# Patient Record
Sex: Female | Born: 1960 | Race: White | Hispanic: No | Marital: Married | Smoking: Never smoker
Health system: Southern US, Community
[De-identification: ages and names within clinical notes are randomized; demographics above are authoritative.]

## PROBLEM LIST (undated history)

## (undated) DIAGNOSIS — M1990 Unspecified osteoarthritis, unspecified site: Secondary | ICD-10-CM

## (undated) DIAGNOSIS — K219 Gastro-esophageal reflux disease without esophagitis: Secondary | ICD-10-CM

## (undated) DIAGNOSIS — Z9289 Personal history of other medical treatment: Secondary | ICD-10-CM

## (undated) DIAGNOSIS — K8020 Calculus of gallbladder without cholecystitis without obstruction: Secondary | ICD-10-CM

## (undated) DIAGNOSIS — N390 Urinary tract infection, site not specified: Secondary | ICD-10-CM

## (undated) DIAGNOSIS — E785 Hyperlipidemia, unspecified: Secondary | ICD-10-CM

## (undated) DIAGNOSIS — I10 Essential (primary) hypertension: Secondary | ICD-10-CM

## (undated) HISTORY — PX: HIP SURGERY: SHX245

## (undated) HISTORY — DX: Calculus of gallbladder without cholecystitis without obstruction: K80.20

## (undated) HISTORY — DX: Urinary tract infection, site not specified: N39.0

## (undated) HISTORY — DX: Gastro-esophageal reflux disease without esophagitis: K21.9

## (undated) HISTORY — DX: Personal history of other medical treatment: Z92.89

---

## 1987-01-12 HISTORY — PX: APPENDECTOMY: SHX54

## 1987-01-12 HISTORY — PX: CHOLECYSTECTOMY: SHX55

## 2007-01-12 HISTORY — PX: TOTAL HIP ARTHROPLASTY: SHX124

## 2018-08-25 ENCOUNTER — Other Ambulatory Visit: Payer: Self-pay

## 2018-08-25 ENCOUNTER — Encounter: Payer: Self-pay | Admitting: *Deleted

## 2018-08-25 ENCOUNTER — Emergency Department
Admission: EM | Admit: 2018-08-25 | Discharge: 2018-08-25 | Payer: Self-pay | Source: Home / Self Care | Attending: Family Medicine | Admitting: Family Medicine

## 2018-08-25 ENCOUNTER — Encounter (HOSPITAL_COMMUNITY): Payer: Self-pay | Admitting: *Deleted

## 2018-08-25 ENCOUNTER — Emergency Department (HOSPITAL_COMMUNITY): Payer: Medicare Other

## 2018-08-25 ENCOUNTER — Inpatient Hospital Stay (HOSPITAL_COMMUNITY)
Admission: EM | Admit: 2018-08-25 | Discharge: 2018-08-27 | DRG: 812 | Disposition: A | Discharge status: Home / Self Care | Payer: Medicare Other | Attending: Internal Medicine | Admitting: Internal Medicine

## 2018-08-25 DIAGNOSIS — Z20828 Contact with and (suspected) exposure to other viral communicable diseases: Secondary | ICD-10-CM | POA: Diagnosis present

## 2018-08-25 DIAGNOSIS — D649 Anemia, unspecified: Secondary | ICD-10-CM

## 2018-08-25 DIAGNOSIS — E785 Hyperlipidemia, unspecified: Secondary | ICD-10-CM | POA: Diagnosis present

## 2018-08-25 DIAGNOSIS — M1990 Unspecified osteoarthritis, unspecified site: Secondary | ICD-10-CM | POA: Diagnosis present

## 2018-08-25 DIAGNOSIS — Z8051 Family history of malignant neoplasm of kidney: Secondary | ICD-10-CM

## 2018-08-25 DIAGNOSIS — R609 Edema, unspecified: Secondary | ICD-10-CM

## 2018-08-25 DIAGNOSIS — D509 Iron deficiency anemia, unspecified: Principal | ICD-10-CM | POA: Diagnosis present

## 2018-08-25 DIAGNOSIS — R600 Localized edema: Secondary | ICD-10-CM

## 2018-08-25 DIAGNOSIS — Z79899 Other long term (current) drug therapy: Secondary | ICD-10-CM

## 2018-08-25 DIAGNOSIS — I10 Essential (primary) hypertension: Secondary | ICD-10-CM | POA: Diagnosis present

## 2018-08-25 DIAGNOSIS — Z993 Dependence on wheelchair: Secondary | ICD-10-CM

## 2018-08-25 DIAGNOSIS — Z96649 Presence of unspecified artificial hip joint: Secondary | ICD-10-CM | POA: Diagnosis present

## 2018-08-25 DIAGNOSIS — R0602 Shortness of breath: Secondary | ICD-10-CM

## 2018-08-25 DIAGNOSIS — Z880 Allergy status to penicillin: Secondary | ICD-10-CM

## 2018-08-25 DIAGNOSIS — Z91048 Other nonmedicinal substance allergy status: Secondary | ICD-10-CM

## 2018-08-25 DIAGNOSIS — I878 Other specified disorders of veins: Secondary | ICD-10-CM | POA: Diagnosis present

## 2018-08-25 DIAGNOSIS — Z9049 Acquired absence of other specified parts of digestive tract: Secondary | ICD-10-CM

## 2018-08-25 DIAGNOSIS — K922 Gastrointestinal hemorrhage, unspecified: Secondary | ICD-10-CM | POA: Diagnosis present

## 2018-08-25 DIAGNOSIS — Z882 Allergy status to sulfonamides status: Secondary | ICD-10-CM

## 2018-08-25 DIAGNOSIS — I517 Cardiomegaly: Secondary | ICD-10-CM

## 2018-08-25 DIAGNOSIS — Z6836 Body mass index (BMI) 36.0-36.9, adult: Secondary | ICD-10-CM

## 2018-08-25 HISTORY — DX: Essential (primary) hypertension: I10

## 2018-08-25 HISTORY — DX: Unspecified osteoarthritis, unspecified site: M19.90

## 2018-08-25 HISTORY — DX: Hyperlipidemia, unspecified: E78.5

## 2018-08-25 LAB — POCT CBC W AUTO DIFF (K'VILLE URGENT CARE)

## 2018-08-25 LAB — PREPARE RBC (CROSSMATCH)

## 2018-08-25 LAB — I-STAT BETA HCG BLOOD, ED (MC, WL, AP ONLY): I-stat hCG, quantitative: <5 m[IU]/mL (ref <5)

## 2018-08-25 LAB — ABO/RH: ABO/RH(D): A POS

## 2018-08-25 LAB — CBC
HCT: 13.7 % — ABNORMAL LOW (ref 36.0–46.0)
Hemoglobin: 3.2 g/dL — CL (ref 12.0–15.0)
MCH: 14.7 pg — ABNORMAL LOW (ref 26.0–34.0)
MCHC: 23.4 g/dL — ABNORMAL LOW (ref 30.0–36.0)
MCV: 62.8 fL — ABNORMAL LOW (ref 80.0–100.0)
Platelets: 347 10*3/uL (ref 150–400)
RBC: 2.18 MIL/uL — ABNORMAL LOW (ref 3.87–5.11)
RDW: 19.4 % — ABNORMAL HIGH (ref 11.5–15.5)
WBC: 8.1 10*3/uL (ref 4.0–10.5)

## 2018-08-25 LAB — COMPREHENSIVE METABOLIC PANEL
ALT: 10 U/L (ref 0–44)
AST: 13 U/L — ABNORMAL LOW (ref 15–41)
Albumin: 3.4 g/dL — ABNORMAL LOW (ref 3.5–5.0)
Alkaline Phosphatase: 64 U/L (ref 38–126)
Anion gap: 11 (ref 5–15)
BUN: 14 mg/dL (ref 6–20)
CO2: 21 mmol/L — ABNORMAL LOW (ref 22–32)
Calcium: 8.7 mg/dL — ABNORMAL LOW (ref 8.9–10.3)
Chloride: 104 mmol/L (ref 98–111)
Creatinine, Ser: 0.56 mg/dL (ref 0.44–1.00)
GFR calc Af Amer: >60 mL/min (ref >60)
GFR calc non Af Amer: >60 mL/min (ref >60)
Glucose, Bld: 111 mg/dL — ABNORMAL HIGH (ref 70–99)
Potassium: 4 mmol/L (ref 3.5–5.1)
Sodium: 136 mmol/L (ref 135–145)
Total Bilirubin: 0.9 mg/dL (ref 0.3–1.2)
Total Protein: 6.2 g/dL — ABNORMAL LOW (ref 6.5–8.1)

## 2018-08-25 LAB — TROPONIN I (HIGH SENSITIVITY): Troponin I (High Sensitivity): 18 ng/L — ABNORMAL HIGH (ref <18)

## 2018-08-25 LAB — BRAIN NATRIURETIC PEPTIDE: B Natriuretic Peptide: 170.6 pg/mL — ABNORMAL HIGH (ref 0.0–100.0)

## 2018-08-25 LAB — POC OCCULT BLOOD, ED: Fecal Occult Bld: NEGATIVE

## 2018-08-25 MED ORDER — SODIUM CHLORIDE 0.9 % IV SOLN
10.0000 mL/h | Freq: Once | INTRAVENOUS | Status: AC
  Administered 2018-08-26: 10 mL/h via INTRAVENOUS

## 2018-08-25 NOTE — ED Triage Notes (Signed)
Pt c/o increased swelling in her feet and ankles since May. She also c/o dizziness, SOB with exertion, intermittent sensation in her chest, can hear her heart beat in her ears, sinus pressure and skin is pale x 3 wks.

## 2018-08-25 NOTE — ED Notes (Signed)
Hemoglobin critical low 3.0. Reported to Dr Assunta Found.

## 2018-08-25 NOTE — ED Notes (Addendum)
Dr.Tegeler notified of HGB 3.2.  Working on getting pt in treatment room.

## 2018-08-25 NOTE — ED Provider Notes (Signed)
[facility] Provider Note   CSN: [id number] Arrival date & time: 08/25/18  1853     History   Chief Complaint Chief Complaint  Patient presents with  . Abnormal Lab    HPI Margaret Gray is a 58 y.o. female.     The history is provided by the patient.  Shortness of Breath Severity:  Moderate Onset quality:  Gradual Duration:  3 weeks Timing:  Constant Progression:  Waxing and waning Chronicity:  New Relieved by:  Nothing Worsened by:  Exertion Associated symptoms: no abdominal pain, no chest pain, no cough, no diaphoresis, no fever, no headaches, no neck pain, no syncope, no vomiting and no wheezing     Past Medical History:  Diagnosis Date  . Arthritis   . Hyperlipidemia   . Hypertension    "10years ago" as of 08/25/18    There are no active problems to display for this patient.   Past Surgical History:  Procedure Laterality Date  . CHOLECYSTECTOMY    . TOTAL HIP ARTHROPLASTY       OB History   No obstetric history on file.      Home Medications    Prior to Admission medications   Medication Sig Start Date End Date Taking? Authorizing Provider  ibuprofen (ADVIL) 600 MG tablet Take 600 mg by mouth every 6 (six) hours as needed.    [provider]  loratadine-pseudoephedrine (CLARITIN-D 12-HOUR) 5-120 MG tablet Take by mouth 2 (two) times daily.    [provider]  omeprazole (PRILOSEC) 20 MG capsule Take 20 mg by mouth daily.    [provider]    Family History Family History  Problem Relation Age of Onset  . Cancer Father        kidney    Social History Social History   Tobacco Use  . Smoking status: Never Smoker  . Smokeless tobacco: Never Used  Substance Use Topics  . Alcohol use: Never    Frequency: Never  . Drug use: Never     Allergies   Penicillins and Sulfa antibiotics   Review of Systems Review of Systems  Constitutional: Positive for fatigue. Negative  for chills, diaphoresis and fever.  HENT: Negative for congestion.   Eyes: Negative for visual disturbance.  Respiratory: Positive for shortness of breath. Negative for cough, chest tightness and wheezing.   Cardiovascular: Negative for chest pain and syncope.  Gastrointestinal: Negative for abdominal pain, blood in stool, constipation, diarrhea, nausea and vomiting.  Genitourinary: Negative for dysuria.  Musculoskeletal: Negative for back pain, neck pain and neck stiffness.  Neurological: Positive for light-headedness. Negative for syncope and headaches.  Psychiatric/Behavioral: Negative for agitation.  All other systems reviewed and are negative.    Physical Exam Updated Vital Signs BP (!) 164/84   Pulse (!) 104   Temp 99.2 F (37.3 C) (Oral)   Resp 18   SpO2 100%   Physical Exam Vitals signs and nursing note reviewed. Exam conducted with a chaperone present.  Constitutional:      General: She is not in acute distress.    Appearance: She is well-developed. She is not ill-appearing, toxic-appearing or diaphoretic.  HENT:     Head: Normocephalic and atraumatic.     Nose: No congestion or rhinorrhea.  Eyes:     Conjunctiva/sclera: Conjunctivae normal.     Pupils: Pupils are equal, round, and reactive to light.  Neck:     Musculoskeletal: Neck supple.  Cardiovascular:  Rate and Rhythm: Normal rate and regular rhythm.     Heart sounds: No murmur.  Pulmonary:     Effort: Pulmonary effort is normal. No respiratory distress.     Breath sounds: Rales present. No wheezing or rhonchi.  Chest:     Chest wall: No tenderness.  Abdominal:     General: Abdomen is flat.     Palpations: Abdomen is soft.     Tenderness: There is no abdominal tenderness.  Genitourinary:    Rectum: Guaiac result negative.  Musculoskeletal:        General: No tenderness.     Right lower leg: Edema present.     Left lower leg: Edema present.  Skin:    General: Skin is warm and dry.     Capillary  Refill: Capillary refill takes less than 2 seconds.     Coloration: Skin is pale.     Findings: No erythema.  Neurological:     General: No focal deficit present.     Mental Status: She is alert and oriented to person, place, and time.     Sensory: No sensory deficit.     Motor: No weakness.  Psychiatric:        Mood and Affect: Mood normal.      ED Treatments / Results  Labs (all labs ordered are listed, but only abnormal results are displayed) Labs Reviewed  COMPREHENSIVE METABOLIC PANEL - Abnormal; Notable for the following components:      Result Value   CO2 21 (*)    Glucose, Bld 111 (*)    Calcium 8.7 (*)    Total Protein 6.2 (*)    Albumin 3.4 (*)    AST 13 (*)    All other components within normal limits  CBC - Abnormal; Notable for the following components:   RBC 2.18 (*)    Hemoglobin 3.2 (*)    HCT 13.7 (*)    MCV 62.8 (*)    MCH 14.7 (*)    MCHC 23.4 (*)    RDW 19.4 (*)    All other components within normal limits  BRAIN NATRIURETIC PEPTIDE - Abnormal; Notable for the following components:   B Natriuretic Peptide 170.6 (*)    All other components within normal limits  TROPONIN I (HIGH SENSITIVITY) - Abnormal; Notable for the following components:   Troponin I (High Sensitivity) 18 (*)    All other components within normal limits  SARS CORONAVIRUS 2  POC OCCULT BLOOD, ED  I-STAT BETA HCG BLOOD, ED ([facility], [facility], [facility] ONLY)  TYPE AND SCREEN  ABO/RH  PREPARE RBC (CROSSMATCH)    EKG EKG Interpretation  Date/Time:  Friday August 25 2018 19:01:04 EDT Ventricular Rate:  107 PR Interval:  142 QRS Duration: 80 QT Interval:  336 QTC Calculation: 448 R Axis:   67 Text Interpretation:  Sinus tachycardia Otherwise normal ECG NO prior ECG for comparison.  NO STEMI Confirmed by Antony Blackbird [phone]) on 08/25/2018 9:31:02 PM   Radiology No results found.  Procedures Procedures (including critical care time)  CRITICAL CARE Performed by: Gwenyth Allegra Clotilda Hafer  Total critical care time: 35 minutes Critical care time was exclusive of separately billable procedures and treating other patients. Critical care was necessary to treat or prevent imminent or life-threatening deterioration. Critical care was time spent personally by me on the following activities: development of treatment plan with patient and/or surrogate as well as nursing, discussions with consultants, evaluation of patient's response to treatment, examination of patient, obtaining  history from patient or surrogate, ordering and performing treatments and interventions, ordering and review of laboratory studies, ordering and review of radiographic studies, pulse oximetry and re-evaluation of patient's condition.   Medications Ordered in ED Medications  0.9 %  sodium chloride infusion (has no administration in time range)     Initial Impression / Assessment and Plan / ED Course  I have reviewed the triage vital signs and the nursing notes.  Pertinent labs & imaging results that were available during my care of the patient were reviewed by me and considered in my medical decision making (see chart for details).        Margaret Gray is a 58 y.o. female with a past medical history sniffing for hypertension hyperlipidemia who presents for shortness of breath, fatigue, near syncope, bilateral lower extremity edema and pallor.  Patient reports that for the last few weeks has been having worsening exertional shortness of breath and lightheadedness.  She reports she almost passed out several days ago but did not fully pass out.  She reports has had chronic hip troubles with a replacement.  She denies any history of GI bleeds or anemia to her knowledge.  She denies any fevers, chills, chest pain, palpitations, nausea, vomiting, urinary symptoms or any GI symptoms.  No dark tarry stools.  Patient reports that she went to urgent care today and had a hemoglobin checked due to the pallor and was found  to have a hemoglobin of 3.0.  Patient was then told to come to the emergency department.  Patient had blood drawn in triage and her hemoglobin was found to be 3.2.  Patient was quickly brought back to an exam room for examination.  On exam, patient is very pale.  Her lungs have very faint crackles in the bases but otherwise no rhonchi or wheezing.  Chest and abdomen nontender.  Fecal occult was performed with a chaperone and was negative.  No gross blood or dark stool seen on exam.   Patient has significant pitting edema in both lower extremities which she reports is been going on for the last few weeks.  Given the patient's hemoglobin of 3.2 with her symptoms, concern for symptomatic anemia.  She will be given to units of blood to start.  Has a fecal is negative, will not call GI but she will need admission for further work-up of her new anemia.  Given the lower extremity edema and exertional shortness of breath, will get a BNP and troponin and chest x-ray to look for fluid.  Patient is maintaining normal oxygen saturations on room air during my evaluation she is not hypotensive.  Patient will be admitted for symptomatic anemia.     Final Clinical Impressions(s) / ED Diagnoses   Final diagnoses:  Symptomatic anemia  Exertional shortness of breath  Peripheral edema    ED Discharge Orders    None      Clinical Impression: 1. Symptomatic anemia   2. Exertional shortness of breath   3. Peripheral edema     Disposition: Admit  This note was prepared with assistance of Dragon voice recognition software. Occasional wrong-word or sound-a-like substitutions may have occurred due to the inherent limitations of voice recognition software.     Daryan Buell, Gwenyth Allegra, MD 08/25/18 [phone]

## 2018-08-25 NOTE — ED Triage Notes (Signed)
For the past three weeks, pt has had exertional sob, lightheadedness, and near syncopal episode on Wednesday. Pt is in her own wheelchair and is very pale. Also reports sinus infection with posterior headache unrelieved with Claritin D. Pt went to [facility] sent her PCP appt is in Sept. Hbg drawn today was 3.0. Denies dark or blood stools.

## 2018-08-25 NOTE — H&P (Signed)
History and Physical    Margaret Gray [facility] DZH:[id number] DOB: [birth year] DOA: 08/25/2018  PCP: System, Pcp Not In Patient coming from: Home  Chief Complaint: Low hemoglobin  HPI: Margaret Gray is a 58 y.o. female with medical history significant of arthritis, hypertension, hyperlipidemia went to urgent care today with complaints of fatigue, shortness of breath, palpitations, and lightheadedness.  Found to be severely anemic with hemoglobin 3.0.  Sent to the ED for further evaluation.  Patient reports fatigue, dyspnea on minimal exertion, heart palpitations, and lightheadedness for the past few weeks.  States a few days ago she almost passed out while ambulating in her house.  Denies history of GI bleed or anemia.  Denies chest pain.  Denies melena, hematochezia, hematemesis, or hematuria.  No abdominal pain.  States she is wheelchair-bound due to hip problems and her legs are normally swollen but since May the swelling is worse.  She is postmenopausal.  ED Course: Mildly tachycardic, remainder of vitals stable.  No gross blood or dark stool seen on exam.  FOBT negative.  Labs showing hemoglobin 3.2, MCV 62.8.  No prior labs in the chart for comparison.  White count and platelet count normal.  BNP 170.  High-sensitivity troponin 18.  EKG without acute ischemic changes.  COVID-19 rapid test pending.  Chest x-ray pending. 2 units of PRBCs ordered.  Review of Systems:  All systems reviewed and apart from history of presenting illness, are negative.  Past Medical History:  Diagnosis Date  . Arthritis   . Hyperlipidemia   . Hypertension    "10years ago" as of 08/25/18    Past Surgical History:  Procedure Laterality Date  . CHOLECYSTECTOMY    . TOTAL HIP ARTHROPLASTY       reports that she has never smoked. She has never used smokeless tobacco. She reports that she does not drink alcohol or use drugs.  Allergies  Allergen Reactions  . Penicillins Rash    Rash on legs Did it involve swelling  of the face/tongue/throat, SOB, or low BP? No Did it involve sudden or severe rash/hives, skin peeling, or any reaction on the inside of your mouth or nose? Yes Did you need to seek medical attention at a hospital or doctor's office? No When did it last happen?58 yrs old If all above answers are "NO", may proceed with cephalosporin use.  . Sulfa Antibiotics Rash  . Tape Itching and Rash    Reaction to surgical tape    Family History  Problem Relation Age of Onset  . Cancer Father        kidney    Prior to Admission medications   Medication Sig Start Date End Date Taking? Authorizing Provider  ibuprofen (ADVIL) 200 MG tablet Take 600 mg by mouth at bedtime.    Yes [provider]  loratadine-pseudoephedrine (CLARITIN-D 12-HOUR) 5-120 MG tablet Take 1 tablet by mouth daily as needed for allergies.    Yes [provider]  omeprazole (PRILOSEC OTC) 20 MG tablet Take 20 mg by mouth at bedtime.   Yes [provider]    Physical Exam: Vitals:   08/26/18 0530 08/26/18 0600 08/26/18 0630 08/26/18 0700  BP: (!) 91/49 (!) 93/52 (!) 95/50 (!) 107/56  Pulse: 81 72 75 74  Resp: 15 13 11 16   Temp:   99 F (37.2 C)   TempSrc:   Oral   SpO2: 100% 100% 100% 100%    Physical Exam  Constitutional: She is oriented to person, place,  and time. She appears well-developed and well-nourished. No distress.  HENT:  Head: Normocephalic.  Mouth/Throat: Oropharynx is clear and moist.  Eyes: Right eye exhibits no discharge. Left eye exhibits no discharge.  Neck: Neck supple.  Cardiovascular: Normal rate, regular rhythm and intact distal pulses.  Pulmonary/Chest: Effort normal and breath sounds normal. No respiratory distress. She has no wheezes. She has no rales.  Abdominal: Soft. Bowel sounds are normal. She exhibits no distension. There is no abdominal tenderness. There is no rebound and no guarding.  Musculoskeletal:        General: Edema present.     Comments: +2  pitting edema bilateral lower extremities  Neurological: She is alert and oriented to person, place, and time.  Skin: Skin is warm and dry. She is not diaphoretic. There is pallor.     Labs on Admission: I have personally reviewed following labs and imaging studies  CBC: Recent Labs  Lab 08/25/18 1945  WBC 8.1  HGB 3.2*  HCT 13.7*  MCV 62.8*  PLT 992   Basic Metabolic Panel: Recent Labs  Lab 08/25/18 1945  NA 136  K 4.0  CL 104  CO2 21*  GLUCOSE 111*  BUN 14  CREATININE 0.56  CALCIUM 8.7*   GFR: CrCl cannot be calculated (Unknown ideal weight.). Liver Function Tests: Recent Labs  Lab 08/25/18 1945  AST 13*  ALT 10  ALKPHOS 64  BILITOT 0.9  PROT 6.2*  ALBUMIN 3.4*   No results for input(s): LIPASE, AMYLASE in the last 168 hours. No results for input(s): AMMONIA in the last 168 hours. Coagulation Profile: No results for input(s): INR, PROTIME in the last 168 hours. Cardiac Enzymes: No results for input(s): CKTOTAL, CKMB, CKMBINDEX, TROPONINI in the last 168 hours. BNP (last 3 results) No results for input(s): PROBNP in the last 8760 hours. HbA1C: No results for input(s): HGBA1C in the last 72 hours. CBG: No results for input(s): GLUCAP in the last 168 hours. Lipid Profile: No results for input(s): CHOL, HDL, LDLCALC, TRIG, CHOLHDL, LDLDIRECT in the last 72 hours. Thyroid Function Tests: No results for input(s): TSH, T4TOTAL, FREET4, T3FREE, THYROIDAB in the last 72 hours. Anemia Panel: No results for input(s): VITAMINB12, FOLATE, FERRITIN, TIBC, IRON, RETICCTPCT in the last 72 hours. Urine analysis: No results found for: COLORURINE, APPEARANCEUR, LABSPEC, [place], GLUCOSEU, HGBUR, BILIRUBINUR, KETONESUR, PROTEINUR, UROBILINOGEN, NITRITE, LEUKOCYTESUR  Radiological Exams on Admission: Dg Chest Portable 1 View  Result Date: 08/26/2018 CLINICAL DATA:  Shortness of breath. EXAM: PORTABLE CHEST 1 VIEW COMPARISON:  None. FINDINGS: Cardiomegaly with slight  globular appearance of the cardiac silhouette. Questionable vascular congestion. No confluent airspace disease. No pleural fluid or pneumothorax. No acute osseous abnormalities. Examination limited by lordotic positioning. IMPRESSION: Cardiomegaly with slight globular appearance of the cardiac silhouette, this can be seen with pericardial effusion. Consider echocardiogram. Questionable vascular congestion. Electronically Signed   By: Keith Rake M.D.   On: 08/26/2018 00:16    EKG: Independently reviewed.  Sinus tachycardia, heart rate 107.  Nonspecific T wave abnormality.  No prior EKG for comparison.  Assessment/Plan Principal Problem:   Symptomatic anemia Active Problems:   Cardiomegaly   Lower extremity edema   Severe symptomatic anemia -Hemodynamically stable.  Hemoglobin 3.2, MCV 62.8.  No prior labs for comparison.  No gross blood or dark stool on exam.  FOBT negative. -Differentials for microcytic anemia include iron deficiency and thalassemia. -Suspicion for occult GI bleed.  Patient has never had a colonoscopy.  Please consult GI in a.m. -  Type and screen -2 units PRBCs ordered in the ED.  Additional 2 units ordered. -Continue to monitor CBC -Check iron, ferritin, TIBC, reticulocyte count  Cardiomegaly, bilateral lower extremity edema Portable chest x-ray personally reviewed showing cardiomegaly with slight globular appearance of the cardiac silhouette (?pericaridal effusion), questionable vascular congestion.  BNP not significantly elevated.  Not tachypneic or hypoxic.  No signs of respiratory distress.  Venous stasis likely contributing to bilateral lower extremity edema as patient is wheelchair-bound. -Echocardiogram -Lasix 40 mg daily p.o.  DVT prophylaxis: SCDs Code Status: Full code Family Communication: Husband sleeping at bedside.  No communication at this time. Disposition Plan: Anticipate discharge after clinical improvement. Consults called: None Admission  status: It is my clinical opinion that referral for OBSERVATION is reasonable and necessary in this patient based on the above information provided. The aforementioned taken together are felt to place the patient at high risk for further clinical deterioration. However it is anticipated that the patient may be medically stable for discharge from the hospital within 24 to 48 hours.  The medical decision making on this patient was of high complexity and the patient is at high risk for clinical deterioration, therefore this is a level 3 visit.  Shela Leff MD [facility] Pager [phone]  If 7PM-7AM, please contact night-coverage [email or web address] Password [facility]  08/26/2018, 7:51 AM

## 2018-08-25 NOTE — ED Provider Notes (Signed)
[facility]    CSN: [id number] Arrival date & time: 08/25/18  1655     History   Chief Complaint Chief Complaint  Patient presents with  . Dizziness    HPI Margaret Gray is a 58 y.o. female.   Patient complains of gradual increase in swelling of her lower legs and feet since May.  During the past 3 weeks she has had gradual increase in fatigue, shortness of breath, palpitations, and light headedness.  She also admits that she has become increasingly pale. She denies chest pain, fever, changes in bowel movements, hematuria. She admits that she has mild reflux symptoms and takes Prilosec 20mg  daily.  The history is provided by the patient and the spouse.    Past Medical History:  Diagnosis Date  . Arthritis   . Hyperlipidemia   . Hypertension     There are no active problems to display for this patient.   Past Surgical History:  Procedure Laterality Date  . CHOLECYSTECTOMY    . TOTAL HIP ARTHROPLASTY      OB History   No obstetric history on file.      Home Medications    Prior to Admission medications   Medication Sig Start Date End Date Taking? Authorizing Provider  ibuprofen (ADVIL) 600 MG tablet Take 600 mg by mouth every 6 (six) hours as needed.   Yes [provider]  loratadine-pseudoephedrine (CLARITIN-D 12-HOUR) 5-120 MG tablet Take by mouth 2 (two) times daily.   Yes [provider]  omeprazole (PRILOSEC) 20 MG capsule Take 20 mg by mouth daily.   Yes [provider]    Family History Family History  Problem Relation Age of Onset  . Cancer Father        kidney    Social History Social History   Tobacco Use  . Smoking status: Never Smoker  . Smokeless tobacco: Never Used  Substance Use Topics  . Alcohol use: Never    Frequency: Never  . Drug use: Never     Allergies   Penicillins and Sulfa antibiotics   Review of Systems Review of Systems  Constitutional: Positive for activity change,  appetite change and fatigue. Negative for chills, diaphoresis, fever and unexpected weight change.  HENT: Positive for congestion.   Eyes: Negative.   Respiratory: Positive for shortness of breath. Negative for chest tightness and wheezing.   Cardiovascular: Positive for palpitations and leg swelling. Negative for chest pain.  Gastrointestinal: Negative for abdominal pain, anal bleeding, constipation, nausea and vomiting.  Genitourinary: Negative for hematuria.  Musculoskeletal: Negative.   Skin: Positive for pallor.  Neurological: Positive for dizziness, weakness and light-headedness. Negative for syncope and headaches.     Physical Exam Triage Vital Signs ED Triage Vitals  Enc Vitals Group     BP 08/25/18 1740 (!) 156/80     Pulse Rate 08/25/18 1740 (!) 107     Resp 08/25/18 1740 18     Temp 08/25/18 1740 98 F (36.7 C)     Temp Source 08/25/18 1740 Oral     SpO2 08/25/18 1740 100 %     Weight --      Height --      Head Circumference --      Peak Flow --      Pain Score 08/25/18 1741 0     Pain Loc --      Pain Edu? --      Excl. in [place]? --    No data  found.  Updated Vital Signs BP (!) 156/80 (BP Location: Right Arm)   Pulse (!) 107   Temp 98 F (36.7 C) (Oral)   Resp 18   SpO2 100%   Visual Acuity Right Eye Distance:   Left Eye Distance:   Bilateral Distance:    Right Eye Near:   Left Eye Near:    Bilateral Near:     Physical Exam Vitals signs and nursing note reviewed.  Constitutional:      General: She is not in acute distress.    Appearance: She is obese. She is not ill-appearing, toxic-appearing or diaphoretic.  HENT:     Head: Normocephalic.     Nose: Nose normal.     Mouth/Throat:     Mouth: Mucous membranes are moist.  Eyes:     Pupils: Pupils are equal, round, and reactive to light.  Cardiovascular:     Rate and Rhythm: Tachycardia present.     Heart sounds: Murmur present.  Pulmonary:     Breath sounds: Normal breath sounds.   Musculoskeletal:     Right lower leg: Edema present.     Left lower leg: Edema present.  Skin:    General: Skin is warm and dry.     Coloration: Skin is pale.  Neurological:     Mental Status: She is alert. She is disoriented.      UC Treatments / Results  Labs (all labs ordered are listed, but only abnormal results are displayed) Labs Reviewed  POCT CBC W AUTO DIFF ([place]):  WBC 8.5; LY 21.1; MO 8.0; GR 70.9; Hgb 3.0; Platelets 403    EKG   Radiology No results found.  Procedures Procedures (including critical care time)  Medications Ordered in UC Medications - No data to display  Initial Impression / Assessment and Plan / UC Course  I have reviewed the triage vital signs and the nursing notes.  Pertinent labs & imaging results that were available during my care of the patient were reviewed by me and considered in my medical decision making (see chart for details).    Severe anemia (Hgb 3.0); note microcytic hypochromic indices. Advised patient and husband to proceed immediately to [facility] ED.  Patient's vital signs stable and she is safe to travel by private vehicle (husband to drive).   Final Clinical Impressions(s) / UC Diagnoses   Final diagnoses:  Severe anemia  Fatigue associated with anemia   Discharge Instructions   None    ED Prescriptions    None         Kandra Nicolas, MD 08/25/18 [phone]

## 2018-08-26 DIAGNOSIS — Z9049 Acquired absence of other specified parts of digestive tract: Secondary | ICD-10-CM | POA: Diagnosis not present

## 2018-08-26 DIAGNOSIS — E785 Hyperlipidemia, unspecified: Secondary | ICD-10-CM | POA: Diagnosis present

## 2018-08-26 DIAGNOSIS — Z8051 Family history of malignant neoplasm of kidney: Secondary | ICD-10-CM | POA: Diagnosis not present

## 2018-08-26 DIAGNOSIS — D509 Iron deficiency anemia, unspecified: Secondary | ICD-10-CM | POA: Diagnosis present

## 2018-08-26 DIAGNOSIS — Z79899 Other long term (current) drug therapy: Secondary | ICD-10-CM | POA: Diagnosis not present

## 2018-08-26 DIAGNOSIS — Z882 Allergy status to sulfonamides status: Secondary | ICD-10-CM | POA: Diagnosis not present

## 2018-08-26 DIAGNOSIS — I10 Essential (primary) hypertension: Secondary | ICD-10-CM | POA: Diagnosis present

## 2018-08-26 DIAGNOSIS — Z20828 Contact with and (suspected) exposure to other viral communicable diseases: Secondary | ICD-10-CM | POA: Diagnosis present

## 2018-08-26 DIAGNOSIS — K922 Gastrointestinal hemorrhage, unspecified: Secondary | ICD-10-CM | POA: Diagnosis present

## 2018-08-26 DIAGNOSIS — I878 Other specified disorders of veins: Secondary | ICD-10-CM | POA: Diagnosis present

## 2018-08-26 DIAGNOSIS — Z880 Allergy status to penicillin: Secondary | ICD-10-CM | POA: Diagnosis not present

## 2018-08-26 DIAGNOSIS — I517 Cardiomegaly: Secondary | ICD-10-CM | POA: Diagnosis present

## 2018-08-26 DIAGNOSIS — R609 Edema, unspecified: Secondary | ICD-10-CM | POA: Diagnosis present

## 2018-08-26 DIAGNOSIS — Z91048 Other nonmedicinal substance allergy status: Secondary | ICD-10-CM | POA: Diagnosis not present

## 2018-08-26 DIAGNOSIS — Z96649 Presence of unspecified artificial hip joint: Secondary | ICD-10-CM | POA: Diagnosis present

## 2018-08-26 DIAGNOSIS — Z993 Dependence on wheelchair: Secondary | ICD-10-CM | POA: Diagnosis not present

## 2018-08-26 DIAGNOSIS — M1990 Unspecified osteoarthritis, unspecified site: Secondary | ICD-10-CM | POA: Diagnosis present

## 2018-08-26 DIAGNOSIS — R600 Localized edema: Secondary | ICD-10-CM

## 2018-08-26 DIAGNOSIS — Z6836 Body mass index (BMI) 36.0-36.9, adult: Secondary | ICD-10-CM | POA: Diagnosis not present

## 2018-08-26 LAB — IRON AND TIBC
Iron: 11 ug/dL — ABNORMAL LOW (ref 28–170)
Saturation Ratios: 2 % — ABNORMAL LOW (ref 10.4–31.8)
TIBC: 587 ug/dL — ABNORMAL HIGH (ref 250–450)
UIBC: 576 ug/dL

## 2018-08-26 LAB — SARS CORONAVIRUS 2 (TAT 6-24 HRS): SARS Coronavirus 2: NEGATIVE

## 2018-08-26 LAB — CBC
HCT: 20.4 % — ABNORMAL LOW (ref 36.0–46.0)
Hemoglobin: 5.7 g/dL — CL (ref 12.0–15.0)
MCH: 19.5 pg — ABNORMAL LOW (ref 26.0–34.0)
MCHC: 27.9 g/dL — ABNORMAL LOW (ref 30.0–36.0)
MCV: 69.6 fL — ABNORMAL LOW (ref 80.0–100.0)
Platelets: 136 10*3/uL — ABNORMAL LOW (ref 150–400)
RBC: 2.93 MIL/uL — ABNORMAL LOW (ref 3.87–5.11)
RDW: 26 % — ABNORMAL HIGH (ref 11.5–15.5)
WBC: 6.1 10*3/uL (ref 4.0–10.5)
nRBC: 0.8 % — ABNORMAL HIGH (ref 0.0–0.2)

## 2018-08-26 LAB — RETICULOCYTES
Immature Retic Fract: 8.9 % (ref 2.3–15.9)
RBC.: 2.93 MIL/uL — ABNORMAL LOW (ref 3.87–5.11)
Retic Count, Absolute: 14.1 10*3/uL — ABNORMAL LOW (ref 19.0–186.0)
Retic Ct Pct: 0.5 % (ref 0.4–3.1)

## 2018-08-26 LAB — FERRITIN: Ferritin: 2 ng/mL — ABNORMAL LOW (ref 11–307)

## 2018-08-26 LAB — VITAMIN B12: Vitamin B-12: 300 pg/mL (ref 180–914)

## 2018-08-26 LAB — PREPARE RBC (CROSSMATCH)

## 2018-08-26 LAB — TROPONIN I (HIGH SENSITIVITY): Troponin I (High Sensitivity): 16 ng/L

## 2018-08-26 MED ORDER — FUROSEMIDE 40 MG PO TABS
40.0000 mg | ORAL_TABLET | Freq: Every day | ORAL | Status: DC
  Administered 2018-08-26 – 2018-08-27 (×2): 40 mg via ORAL
  Filled 2018-08-26: qty 1
  Filled 2018-08-26: qty 2

## 2018-08-26 MED ORDER — ACETAMINOPHEN 325 MG PO TABS
650.0000 mg | ORAL_TABLET | Freq: Four times a day (QID) | ORAL | Status: DC | PRN
  Administered 2018-08-26: 650 mg via ORAL
  Filled 2018-08-26: qty 2

## 2018-08-26 MED ORDER — CYANOCOBALAMIN 1000 MCG/ML IJ SOLN
1000.0000 ug | Freq: Every day | INTRAMUSCULAR | Status: DC
  Administered 2018-08-26 – 2018-08-27 (×2): 1000 ug via INTRAMUSCULAR
  Filled 2018-08-26 (×3): qty 1

## 2018-08-26 MED ORDER — SODIUM CHLORIDE 0.9% IV SOLUTION
Freq: Once | INTRAVENOUS | Status: AC
  Administered 2018-08-26: 07:00:00 via INTRAVENOUS

## 2018-08-26 MED ORDER — ACETAMINOPHEN 650 MG RE SUPP: 650.0000 mg | Freq: Four times a day (QID) | RECTAL | Status: DC | PRN

## 2018-08-26 NOTE — Progress Notes (Signed)
PROGRESS NOTE    Margaret Gray [facility]  ZDG:[id number] DOB: May 02, 1960 DOA: 08/25/2018 PCP: System, Pcp Not In    Brief Narrative:  58 year old female with history of osteoarthritis, hypertension, hyperlipidemia went to the urgent care with complaints of fatigue, shortness of breath, palpitations, lightheadedness.  Patient was found to have severe anemia with hemoglobin of 3, sent to emergency department for further evaluation.  Denied having any GI bleed, melena.  However patient takes ibuprofen 600 mg on regular basis.  Patient is wheelchair-bound, also noticed to have increased swelling for the last few months.  In the emergency department patient was mildly tachycardic, had hemoglobin of 3.2, MCV 62.  Stool occult was negative.  Patient received 2 units of packed RBC, with improvement of hemoglobin to 5.7.  Ordered another 2 units of packed RBC.  Iron profile shows low iron of 11, elevated TIBC consistent with iron deficiency anemia.  Patient also has low B12 of 300.  ##Severe iron deficiency anemia symptomatic -Admitted with hemoglobin of 3.2 -Most likely from GI bleed -Patient states has been postmenopausal for 10 years -Iron profile shows significantly low iron of 11, elevated iron binding capacity, low B12 of 300 -Stool occult is negative, possibility of intermittent bleed from NSAID induced ulcers -Consult GI as patient never had colonoscopy or EGD in the past -Keep the patient on Protonix 40 mg twice daily -Patient will be receiving 4 units of packed RBC  ##Shortness of breath with exertion -BNP is 150 -Get echocardiogram -Most likely from severe anemia -On p.o. Lasix  ##Morbid obesity -Patient states lost about 200 pounds of weight by herself  ##Osteoarthritis, wheelchair-bound status -Patient is able to transfer herself -Continue with physical therapy    Assessment & Plan:   Principal Problem:   Symptomatic anemia Active Problems:   Cardiomegaly   Lower extremity edema      DVT prophylaxis: SCDs  code Status: (Full Family Communication: Patient  disposition Plan: Home   Consultants:   Gastroenterology   Subjective: Feels improved after receiving the transfusion  Objective: Vitals:   08/26/18 0930 08/26/18 1000 08/26/18 1100 08/26/18 1400  BP: (!) 100/50 123/70 (!) 116/56 117/63  Pulse: 73 96 86 79  Resp: 14 14 20 17   Temp:      TempSrc:      SpO2: 100% 99% 99% 100%    Intake/Output Summary (Last 24 hours) at 08/26/2018 1500 Last data filed at 08/26/2018 1040 Gross per 24 hour  Intake 945 ml  Output 1700 ml  Net -755 ml   There were no vitals filed for this visit.  Examination:  General exam: Appears calm and comfortable  Respiratory system: Clear to auscultation. Respiratory effort normal. Cardiovascular system: S1 & S2 heard, RRR. No JVD, murmurs, rubs, gallops or clicks. No pedal edema. Gastrointestinal system: Abdomen is nondistended, soft and nontender. No organomegaly or masses felt. Normal bowel sounds heard. Central nervous system: Alert and oriented. No focal neurological deficits. Extremities: Symmetric 5 x 5 power. Skin: No rashes, lesions or ulcers Psychiatry: Judgement and insight appear normal. Mood & affect appropriate.     Data Reviewed: I have personally reviewed following labs and imaging studies  CBC: Recent Labs  Lab 08/25/18 1945 08/26/18 1057  WBC 8.1 6.1  HGB 3.2* 5.7*  HCT 13.7* 20.4*  MCV 62.8* 69.6*  PLT 347 595*   Basic Metabolic Panel: Recent Labs  Lab 08/25/18 1945  NA 136  K 4.0  CL 104  CO2 21*  GLUCOSE 111*  BUN  14  CREATININE 0.56  CALCIUM 8.7*   GFR: CrCl cannot be calculated (Unknown ideal weight.). Liver Function Tests: Recent Labs  Lab 08/25/18 1945  AST 13*  ALT 10  ALKPHOS 64  BILITOT 0.9  PROT 6.2*  ALBUMIN 3.4*   No results for input(s): LIPASE, AMYLASE in the last 168 hours. No results for input(s): AMMONIA in the last 168 hours. Coagulation Profile:  No results for input(s): INR, PROTIME in the last 168 hours. Cardiac Enzymes: No results for input(s): CKTOTAL, CKMB, CKMBINDEX, TROPONINI in the last 168 hours. BNP (last 3 results) No results for input(s): PROBNP in the last 8760 hours. HbA1C: No results for input(s): HGBA1C in the last 72 hours. CBG: No results for input(s): GLUCAP in the last 168 hours. Lipid Profile: No results for input(s): CHOL, HDL, LDLCALC, TRIG, CHOLHDL, LDLDIRECT in the last 72 hours. Thyroid Function Tests: No results for input(s): TSH, T4TOTAL, FREET4, T3FREE, THYROIDAB in the last 72 hours. Anemia Panel: Recent Labs    08/26/18 1057  VITAMINB12 300  FERRITIN 2*  TIBC 587*  IRON 11*  RETICCTPCT 0.5   Sepsis Labs: No results for input(s): PROCALCITON, LATICACIDVEN in the last 168 hours.  Recent Results (from the past 240 hour(s))  SARS CORONAVIRUS 2 Nasal Swab Aptima Multi Swab     Status: None   Collection Time: 08/25/18 11:59 PM   Specimen: Aptima Multi Swab; Nasal Swab  Result Value Ref Range Status   SARS Coronavirus 2 NEGATIVE NEGATIVE Final    Comment: (NOTE) SARS-CoV-2 target nucleic acids are NOT DETECTED. The SARS-CoV-2 RNA is generally detectable in upper and lower respiratory specimens during the acute phase of infection. Negative results do not preclude SARS-CoV-2 infection, do not rule out co-infections with other pathogens, and should not be used as the sole basis for treatment or other patient management decisions. Negative results must be combined with clinical observations, patient history, and epidemiological information. The expected result is Negative. Fact Sheet for Patients: SugarRoll.be Fact Sheet for Healthcare Providers: https://www.woods-mathews.com/ This test is not yet approved or cleared by the [place] FDA and  has been authorized for detection and/or diagnosis of SARS-CoV-2 by FDA under an Emergency Use  Authorization (EUA). This EUA will remain  in effect (meaning this test can be used) for the duration of the COVID-19 declaration under Section 56 4(b)(1) of the Act, 21 U.S.C. section 360bbb-3(b)(1), unless the authorization is terminated or revoked sooner. Performed at [facility], [place] [address], [place], [place] [address]          Radiology Studies: Dg Chest Portable 1 View  Result Date: 08/26/2018 CLINICAL DATA:  Shortness of breath. EXAM: PORTABLE CHEST 1 VIEW COMPARISON:  None. FINDINGS: Cardiomegaly with slight globular appearance of the cardiac silhouette. Questionable vascular congestion. No confluent airspace disease. No pleural fluid or pneumothorax. No acute osseous abnormalities. Examination limited by lordotic positioning. IMPRESSION: Cardiomegaly with slight globular appearance of the cardiac silhouette, this can be seen with pericardial effusion. Consider echocardiogram. Questionable vascular congestion. Electronically Signed   By: Keith Rake M.D.   On: 08/26/2018 00:16        Scheduled Meds: . furosemide  40 mg Oral Daily   Continuous Infusions:   LOS: 0 days    Time spent: 35 minutes   Ysabelle Goodroe, MD [facility] Pager [phone]  If 7PM-7AM, please contact night-coverage [email or web address] Password [email or web address] 08/26/2018, 3:00 PM

## 2018-08-26 NOTE — ED Notes (Signed)
No blood draw,  Pt receiving blood transfusion.

## 2018-08-27 ENCOUNTER — Inpatient Hospital Stay (HOSPITAL_COMMUNITY): Payer: Medicare Other

## 2018-08-27 DIAGNOSIS — I517 Cardiomegaly: Secondary | ICD-10-CM

## 2018-08-27 LAB — ECHOCARDIOGRAM COMPLETE
Height: 66 in
Weight: 3571.2 oz

## 2018-08-27 LAB — HEMOGLOBIN AND HEMATOCRIT, BLOOD
HCT: 24.9 % — ABNORMAL LOW (ref 36.0–46.0)
Hemoglobin: 7.4 g/dL — ABNORMAL LOW (ref 12.0–15.0)

## 2018-08-27 LAB — HIV ANTIBODY (ROUTINE TESTING W REFLEX): HIV Screen 4th Generation wRfx: NONREACTIVE

## 2018-08-27 MED ORDER — SODIUM CHLORIDE 0.9 % IV SOLN
125.0000 mg | Freq: Once | INTRAVENOUS | Status: AC
  Administered 2018-08-27: 125 mg via INTRAVENOUS
  Filled 2018-08-27: qty 10

## 2018-08-27 MED ORDER — PANTOPRAZOLE SODIUM 40 MG PO TBEC
40.0000 mg | DELAYED_RELEASE_TABLET | Freq: Two times a day (BID) | ORAL | Status: DC
  Administered 2018-08-27: 40 mg via ORAL
  Filled 2018-08-27: qty 1

## 2018-08-27 MED ORDER — PANTOPRAZOLE SODIUM 40 MG PO TBEC: 40.0000 mg | DELAYED_RELEASE_TABLET | Freq: Two times a day (BID) | ORAL | 0 refills | Status: DC

## 2018-08-27 MED ORDER — FERROUS SULFATE 325 (65 FE) MG PO TABS: 325.0000 mg | ORAL_TABLET | Freq: Three times a day (TID) | ORAL | 3 refills | Status: DC

## 2018-08-27 MED ORDER — VITAMIN B-12 1000 MCG PO TABS: 1000.0000 ug | ORAL_TABLET | Freq: Every day | ORAL | 1 refills | Status: AC

## 2018-08-27 NOTE — Care Management (Signed)
Patient provided with [facility] letter. No other CM needs identified.

## 2018-08-27 NOTE — Progress Notes (Signed)
  Echocardiogram 2D Echocardiogram has been performed.  Margaret Gray 08/27/2018, 9:36 AM

## 2018-08-27 NOTE — Plan of Care (Signed)

## 2018-08-27 NOTE — Discharge Summary (Signed)
Physician Discharge Summary  Shatiqua [facility] YNW:[id number] DOB: April 04, 1960 DOA: 08/25/2018  PCP: System, Pcp Not In  Admit date: 08/25/2018 Discharge date: 08/27/2018  Time spent: 40 minutes  Recommendations for Outpatient Follow-up:  1. Follow-up with primary care physician in 1 week 2. Follow-up with gastroenterology, hematology in 1 to 2 weeks 3. Avoid over-the-counter pain medications.   Discharge Diagnoses:  Principal Problem:   Symptomatic anemia Active Problems:   Cardiomegaly   Lower extremity edema   Discharge Condition: Stable  Diet recommendation: Regular  Filed Weights   08/27/18 0444  Weight: 101.2 kg    History of present illness and Hospital Course:  58 year old female with history of osteoarthritis, hypertension, hyperlipidemia went to the urgent care with complaints of fatigue, shortness of breath, palpitations, lightheadedness.  Patient was found to have severe anemia with hemoglobin of 3, sent to emergency department for further evaluation.  Denied having any GI bleed, melena.  However patient takes ibuprofen 600 mg on regular basis.  Patient is wheelchair-bound, also noticed to have increased swelling for the last few months.  In the emergency department patient was mildly tachycardic, had hemoglobin of 3.2, MCV 62.  Stool occult was negative.  Patient received 2 units of packed RBC, with improvement of hemoglobin to 5.7.  Ordered another 2 units of packed RBC.  Iron profile shows low iron of 11, elevated TIBC consistent with iron deficiency anemia.  Patient also has low B12 of 300.  Patient is given IV iron, B12.  Considering patient's her severe iron deficiency anemia, consulted gastroenterology.  As the patient does not have any active bleed at this time, recommended patient to follow-up with gastroenterology as an outpatient for EGD and colonoscopy.  Also recommended patient to follow-up with hematology as an outpatient.  Hemoglobin trended up to 7.2 appropriately  after 4 units of packed RBC.  Patient was experiencing shortness of breath with exertion, however improved with Lasix.  Echocardiogram showed preserved left ventricular function, no wall motion abnormalities.  Recommended to avoid over-the-counter pain medications.  ##Severe iron deficiency anemia symptomatic -Admitted with hemoglobin of 3.2 -Most likely from GI bleed -Patient states has been postmenopausal for 10 years -Iron profile shows significantly low iron of 11, elevated iron binding capacity, low B12 of 300 -Stool occult is negative, possibility of intermittent bleed from NSAID induced ulcers -Consult GI as patient never had colonoscopy or EGD in the past -Keep the patient on Protonix 40 mg twice daily -Patient will be receiving 4 units of packed RBC  ##Shortness of breath with exertion -BNP is 150 -Get echocardiogram-has preserved left ventricular function, no valvular abnormalities -Most likely from severe anemia -On p.o. Lasix  ##Morbid obesity -Patient states lost about 200 pounds of weight by herself  ##Osteoarthritis, wheelchair-bound status -Patient is able to transfer herself -Continue with physical therapy   Procedures: Echocardiogram  Discharge Exam: Vitals:   08/27/18 0552 08/27/18 0818  BP: (!) 117/56 133/76  Pulse: 70 77  Resp: 20 18  Temp: 98.3 F (36.8 C) 98.3 F (36.8 C)  SpO2: 96% 97%    General exam: Appears calm and comfortable  Respiratory system: Clear to auscultation. Respiratory effort normal. Cardiovascular system: S1 & S2 heard, RRR. No JVD, murmurs, rubs, gallops or clicks. No pedal edema. Gastrointestinal system: Abdomen is nondistended, soft and nontender. No organomegaly or masses felt. Normal bowel sounds heard. Central nervous system: Alert and oriented. No focal neurological deficits. Extremities: Symmetric 5 x 5 power. Skin: No rashes, lesions or ulcers Psychiatry:  Judgement and insight appear normal. Mood & affect  appropriate.  Discharge Instructions   Discharge Instructions    Diet - low sodium heart healthy   Complete by: As directed    Increase activity slowly   Complete by: As directed      Allergies as of 08/27/2018      Reactions   Penicillins Rash   Rash on legs Did it involve swelling of the face/tongue/throat, SOB, or low BP? No Did it involve sudden or severe rash/hives, skin peeling, or any reaction on the inside of your mouth or nose? Yes Did you need to seek medical attention at a hospital or doctor's office? No When did it last happen?58 yrs old If all above answers are "NO", may proceed with cephalosporin use.   Sulfa Antibiotics Rash   Tape Itching, Rash   Reaction to surgical tape      Medication List    STOP taking these medications   ibuprofen 200 MG tablet Commonly known as: ADVIL   loratadine-pseudoephedrine 5-120 MG tablet Commonly known as: CLARITIN-D 12-hour   omeprazole 20 MG tablet Commonly known as: PRILOSEC OTC     TAKE these medications   ferrous sulfate 325 (65 FE) MG tablet Commonly known as: Feosol Take 1 tablet (325 mg total) by mouth 3 (three) times daily with meals.   pantoprazole 40 MG tablet Commonly known as: PROTONIX Take 1 tablet (40 mg total) by mouth 2 (two) times daily.   vitamin B-12 1000 MCG tablet Commonly known as: CYANOCOBALAMIN Take 1 tablet (1,000 mcg total) by mouth daily.      Allergies  Allergen Reactions  . Penicillins Rash    Rash on legs Did it involve swelling of the face/tongue/throat, SOB, or low BP? No Did it involve sudden or severe rash/hives, skin peeling, or any reaction on the inside of your mouth or nose? Yes Did you need to seek medical attention at a hospital or doctor's office? No When did it last happen?58 yrs old If all above answers are "NO", may proceed with cephalosporin use.  . Sulfa Antibiotics Rash  . Tape Itching and Rash    Reaction to surgical tape   Follow-up Information     Juanita Craver, MD. Schedule an appointment as soon as possible for a visit in 1 week(s).   Specialty: Gastroenterology Contact information: [address], [place] [address] [phone]        Wyatt Portela, MD. Schedule an appointment as soon as possible for a visit in 1 week(s).   Specialty: Oncology Contact information: [place] [address] [phone]            The results of significant diagnostics from this hospitalization (including imaging, microbiology, ancillary and laboratory) are listed below for reference.    Significant Diagnostic Studies: Dg Chest Portable 1 View  Result Date: 08/26/2018 CLINICAL DATA:  Shortness of breath. EXAM: PORTABLE CHEST 1 VIEW COMPARISON:  None. FINDINGS: Cardiomegaly with slight globular appearance of the cardiac silhouette. Questionable vascular congestion. No confluent airspace disease. No pleural fluid or pneumothorax. No acute osseous abnormalities. Examination limited by lordotic positioning. IMPRESSION: Cardiomegaly with slight globular appearance of the cardiac silhouette, this can be seen with pericardial effusion. Consider echocardiogram. Questionable vascular congestion. Electronically Signed   By: Keith Rake M.D.   On: 08/26/2018 00:16    Microbiology: Recent Results (from the past 240 hour(s))  SARS CORONAVIRUS 2 Nasal Swab Aptima Multi Swab  Status: None   Collection Time: 08/25/18 11:59 PM   Specimen: Aptima Multi Swab; Nasal Swab  Result Value Ref Range Status   SARS Coronavirus 2 NEGATIVE NEGATIVE Final    Comment: (NOTE) SARS-CoV-2 target nucleic acids are NOT DETECTED. The SARS-CoV-2 RNA is generally detectable in upper and lower respiratory specimens during the acute phase of infection. Negative results do not preclude SARS-CoV-2 infection, do not rule out co-infections with other pathogens, and should not be used as the sole basis for treatment or other  patient management decisions. Negative results must be combined with clinical observations, patient history, and epidemiological information. The expected result is Negative. Fact Sheet for Patients: SugarRoll.be Fact Sheet for Healthcare Providers: https://www.woods-mathews.com/ This test is not yet approved or cleared by the [place] FDA and  has been authorized for detection and/or diagnosis of SARS-CoV-2 by FDA under an Emergency Use Authorization (EUA). This EUA will remain  in effect (meaning this test can be used) for the duration of the COVID-19 declaration under Section 56 4(b)(1) of the Act, 21 U.S.C. section 360bbb-3(b)(1), unless the authorization is terminated or revoked sooner. Performed at [facility], [place] [address] [place], [place] [address]      Labs: Basic Metabolic Panel: Recent Labs  Lab 08/25/18 1945  NA 136  K 4.0  CL 104  CO2 21*  GLUCOSE 111*  BUN 14  CREATININE 0.56  CALCIUM 8.7*   Liver Function Tests: Recent Labs  Lab 08/25/18 1945  AST 13*  ALT 10  ALKPHOS 64  BILITOT 0.9  PROT 6.2*  ALBUMIN 3.4*   No results for input(s): LIPASE, AMYLASE in the last 168 hours. No results for input(s): AMMONIA in the last 168 hours. CBC: Recent Labs  Lab 08/25/18 1945 08/26/18 1057 08/27/18 0718  WBC 8.1 6.1  --   HGB 3.2* 5.7* 7.4*  HCT 13.7* 20.4* 24.9*  MCV 62.8* 69.6*  --   PLT 347 136*  --    Cardiac Enzymes: No results for input(s): CKTOTAL, CKMB, CKMBINDEX, TROPONINI in the last 168 hours. BNP: BNP (last 3 results) Recent Labs    08/25/18 1945  BNP 170.6*    ProBNP (last 3 results) No results for input(s): PROBNP in the last 8760 hours.  CBG: No results for input(s): GLUCAP in the last 168 hours.     SignedMonica Becton MD.  [facility] 08/27/2018, 12:27 PM

## 2018-08-28 LAB — TYPE AND SCREEN
ABO/RH(D): A POS
Antibody Screen: NEGATIVE
Unit division: 0
Unit division: 0
Unit division: 0
Unit division: 0

## 2018-08-28 LAB — BPAM RBC
Blood Product Expiration Date: 202008312359
Blood Product Expiration Date: 202008312359
Blood Product Expiration Date: 202009112359
Blood Product Expiration Date: 202009112359
ISSUE DATE / TIME: 202008142236
ISSUE DATE / TIME: 202008150302
ISSUE DATE / TIME: 202008151928
ISSUE DATE / TIME: 202008160250
Unit Type and Rh: 6200
Unit Type and Rh: 6200
Unit Type and Rh: 6200
Unit Type and Rh: 6200

## 2018-08-28 LAB — FOLATE RBC
Folate, Hemolysate: 311 ng/mL
Folate, RBC: 1460 ng/mL (ref >498)
Hematocrit: 21.3 % — ABNORMAL LOW (ref 34.0–46.6)

## 2018-08-31 ENCOUNTER — Ambulatory Visit (INDEPENDENT_AMBULATORY_CARE_PROVIDER_SITE_OTHER): Payer: Self-pay | Admitting: Osteopathic Medicine

## 2018-08-31 ENCOUNTER — Encounter: Payer: Self-pay | Admitting: Osteopathic Medicine

## 2018-08-31 ENCOUNTER — Other Ambulatory Visit: Payer: Self-pay

## 2018-08-31 ENCOUNTER — Encounter: Payer: Self-pay | Admitting: Gastroenterology

## 2018-08-31 VITALS — BP 170/92 | HR 91 | Temp 98.1°F | Ht 66.0 in | Wt 228.2 lb

## 2018-08-31 DIAGNOSIS — I10 Essential (primary) hypertension: Secondary | ICD-10-CM

## 2018-08-31 DIAGNOSIS — E782 Mixed hyperlipidemia: Secondary | ICD-10-CM

## 2018-08-31 DIAGNOSIS — E611 Iron deficiency: Secondary | ICD-10-CM

## 2018-08-31 DIAGNOSIS — D649 Anemia, unspecified: Secondary | ICD-10-CM

## 2018-08-31 DIAGNOSIS — M1991 Primary osteoarthritis, unspecified site: Secondary | ICD-10-CM

## 2018-08-31 DIAGNOSIS — E8809 Other disorders of plasma-protein metabolism, not elsewhere classified: Secondary | ICD-10-CM

## 2018-08-31 DIAGNOSIS — E538 Deficiency of other specified B group vitamins: Secondary | ICD-10-CM

## 2018-08-31 DIAGNOSIS — L0390 Cellulitis, unspecified: Secondary | ICD-10-CM

## 2018-08-31 MED ORDER — FLUCONAZOLE 150 MG PO TABS: 150.0000 mg | ORAL_TABLET | Freq: Once | ORAL | 1 refills | Status: AC

## 2018-08-31 MED ORDER — DOXYCYCLINE HYCLATE 100 MG PO TABS: 100.0000 mg | ORAL_TABLET | Freq: Two times a day (BID) | ORAL | 0 refills | Status: DC

## 2018-08-31 NOTE — Patient Instructions (Addendum)
Referral placed to GI Labs recheck today Will plan to check cholesterol and B12 later date  Continue current medications Tylenol up to 1000 mg 3-4 times per day is ok to take Will recheck BP in 1-2 weeks

## 2018-08-31 NOTE — Progress Notes (Signed)
HPI: Margaret Gray is a 58 y.o. female who  has a past medical history of Arthritis, Hyperlipidemia, and Hypertension.  she presents to [facility] today, 08/31/18,  for chief complaint of: New to establish care Recent hospitalization - anemia      Discharge summary reviewed:  Patient hospitalized 08/25/2018 to 08/27/2018 for symptomatic anemia, associated problems included cardiomegaly and lower extremity edema.  Initially presented to urgent care with chief complaint of fatigue, SOB, palpitations, lightheaded.  Hemoglobin of 3, sent to ED.  Consistent with iron deficiency anemia, was given IV iron, IV B12.  Advised outpatient follow-up with GI for EGD/colonoscopy given no evidence of GI bleed, the patient reported frequent use of NSAIDs.  Patient reported some shortness of breath with exertion but improved with Lasix, echocardiogram showed preserved LV function.  Patient received total 4 units packed RBC.  Advised to continue Protonix 40 mg twice daily.  History of osteoarthritis, wheelchair bound but patient able to transfer self.  Continue PT. Hemoglobin 7.4 at time of discharge.   Today:   Concerned for hand pain on dorsum L hand where IV was placed, concerned about infection or damage from the IV. Requests labs to recheck levels. Otherwise feeling improved since hospitalization.  No fever or chills.  Trying to get back into her exercise routine.  Has not really been eating very healthy.    Reports history of hypertension and hyperlipidemia but was taken off of all of her medications a couple years ago around the time of her hip replacement.  Has not had blood pressure professionally checked in some time.  Has a blood pressure monitor at home, states blood pressure numbers are better there.  Concern for sinus infection, pressure for a couple weeks.   Declined vaccines.  Never smoker Postmenopausal, G1/P1     Past medical, surgical, social and  family history reviewed:  Patient Active Problem List   Diagnosis Date Noted  . Cardiomegaly 08/26/2018  . Lower extremity edema 08/26/2018  . Symptomatic anemia 08/25/2018    Past Surgical History:  Procedure Laterality Date  . CHOLECYSTECTOMY    . TOTAL HIP ARTHROPLASTY      Social History   Tobacco Use  . Smoking status: Never Smoker  . Smokeless tobacco: Never Used  Substance Use Topics  . Alcohol use: Never    Frequency: Never    Family History  Problem Relation Age of Onset  . Cancer Father        kidney     Current medication list and allergy/intolerance information reviewed:    Current Outpatient Medications  Medication Sig Dispense Refill  . ferrous sulfate (FEOSOL) 325 (65 FE) MG tablet Take 1 tablet (325 mg total) by mouth 3 (three) times daily with meals. 90 tablet 3  . pantoprazole (PROTONIX) 40 MG tablet Take 1 tablet (40 mg total) by mouth 2 (two) times daily. 60 tablet 0  . vitamin B-12 (CYANOCOBALAMIN) 1000 MCG tablet Take 1 tablet (1,000 mcg total) by mouth daily. 30 tablet 1   No current facility-administered medications for this visit.     Allergies  Allergen Reactions  . Penicillins Rash    Rash on legs Did it involve swelling of the face/tongue/throat, SOB, or low BP? No Did it involve sudden or severe rash/hives, skin peeling, or any reaction on the inside of your mouth or nose? Yes Did you need to seek medical attention at a hospital or doctor's office? No When did it last happen?[place]  yrs old If all above answers are "NO", may proceed with cephalosporin use.  . Sulfa Antibiotics Rash  . Tape Itching and Rash    Reaction to surgical tape      Review of Systems:  Constitutional:  No  fever, no chills, No recent illness, No unintentional weight changes. +significant fatigue.   HEENT: No  headache, no vision change, no hearing change, No sore throat, No  sinus pressure  Cardiac: No  chest pain, No  pressure, No palpitations, No   Orthopnea, +LE edema   Respiratory:  +shortness of breath. No  Cough  Gastrointestinal: No  abdominal pain, No  nausea, No  vomiting,  No  blood in stool, No  diarrhea, No  constipation   Musculoskeletal: No new myalgia/arthralgia  Skin: No  Rash, No other wounds/concerning lesions  Genitourinary: No  incontinence, No  abnormal genital bleeding, No abnormal genital discharge  Hem/Onc: No  easy bruising/bleeding, No  abnormal lymph node  Endocrine: No cold intolerance,  No heat intolerance. No polyuria/polydipsia/polyphagia   Neurologic: No  weakness, +dizziness, No  slurred speech/focal weakness/facial droop  Psychiatric: No  concerns with depression, No  concerns with anxiety, No sleep problems, No mood problems  Exam:  BP (!) 170/92 (BP Location: Left Arm, Patient Position: Sitting, Cuff Size: Large)   Pulse 91   Temp 98.1 F (36.7 C) (Oral)   Ht 5\' 6"  (1.676 m)   Wt 228 lb 3.2 oz (103.5 kg)   BMI 36.83 kg/m   Constitutional: VS see above. General Appearance: alert, well-developed, well-nourished, NAD  Eyes: Normal lids and conjunctive, non-icteric sclera  Respiratory: Normal respiratory effort. no wheeze, no rhonchi, no rales  Cardiovascular: S1/S2 normal, +murmur, no rub/gallop auscultated. RRR. Trace lower extremity edema.   Musculoskeletal: Gait not examined, pt in wheelchair. No clubbing/cyanosis of digits.   Neurological: Normal balance/coordination. No tremor.   Skin: warm, dry, intact. Small area of erythema/tenderness w/ very mild edema on dorsum of L hand   Psychiatric: Normal judgment/insight. Normal mood and affect. Oriented x3.       ASSESSMENT/PLAN: The primary encounter diagnosis was Symptomatic anemia. Diagnoses of Iron deficiency, B12 deficiency, Hypoalbuminemia, Hypocalcemia, Primary osteoarthritis, unspecified site, Essential hypertension, Mixed hyperlipidemia, and Cellulitis, unspecified cellulitis site were also pertinent to this  visit.      Orders Placed This Encounter  Procedures  . CBC  . Fe+TIBC+Fer  . COMPLETE METABOLIC PANEL WITH GFR  . Ambulatory referral to Gastroenterology    Meds ordered this encounter  Medications  . doxycycline (VIBRA-TABS) 100 MG tablet    Sig: Take 1 tablet (100 mg total) by mouth 2 (two) times daily.    Dispense:  10 tablet    Refill:  0  . fluconazole (DIFLUCAN) 150 MG tablet    Sig: Take 1 tablet (150 mg total) by mouth once for 1 dose. Repeat dose 72 hours if yeast infection persists    Dispense:  2 tablet    Refill:  1    Patient Instructions  Referral placed to GI Labs recheck today Will plan to check cholesterol and B12 later date  Continue current medications Tylenol up to 1000 mg 3-4 times per day is ok to take Will recheck BP in 1-2 weeks         Visit summary with medication list and pertinent instructions was printed for patient to review. All questions at time of visit were answered - patient instructed to contact office with any additional concerns or updates. ER/RTC  precautions were reviewed with the patient.     Please note: voice recognition software was used to produce this document, and typos may escape review. Please contact Dr. Sheppard Coil for any needed clarifications.     Follow-up plan: Return in about 2 weeks (around 09/14/2018) for recheck BP, bring home BP monitor to office! Marland Kitchen

## 2018-09-01 LAB — IRON,TIBC AND FERRITIN PANEL
%SAT: 26 % (calc) (ref 16–45)
Ferritin: 29 ng/mL (ref 16–232)
Iron: 127 ug/dL (ref 45–160)
TIBC: 490 mcg/dL (calc) — ABNORMAL HIGH (ref 250–450)

## 2018-09-01 LAB — COMPLETE METABOLIC PANEL WITH GFR
AG Ratio: 1.7 (calc) (ref 1.0–2.5)
ALT: 29 U/L (ref 6–29)
AST: 30 U/L (ref 10–35)
Albumin: 3.9 g/dL (ref 3.6–5.1)
Alkaline phosphatase (APISO): 69 U/L (ref 37–153)
BUN: 18 mg/dL (ref 7–25)
CO2: 26 mmol/L (ref 20–32)
Calcium: 9 mg/dL (ref 8.6–10.4)
Chloride: 106 mmol/L (ref 98–110)
Creat: 0.67 mg/dL (ref 0.50–1.05)
GFR, Est African American: 114 mL/min/{1.73_m2} (ref >=60)
GFR, Est Non African American: 98 mL/min/{1.73_m2} (ref >=60)
Globulin: 2.3 g/dL (calc) (ref 1.9–3.7)
Glucose, Bld: 86 mg/dL (ref 65–99)
Potassium: 4.8 mmol/L (ref 3.5–5.3)
Sodium: 140 mmol/L (ref 135–146)
Total Bilirubin: 0.6 mg/dL (ref 0.2–1.2)
Total Protein: 6.2 g/dL (ref 6.1–8.1)

## 2018-09-01 LAB — CBC
HCT: 30.8 % — ABNORMAL LOW (ref 35.0–45.0)
Hemoglobin: 8.8 g/dL — ABNORMAL LOW (ref 11.7–15.5)
MCH: 22.7 pg — ABNORMAL LOW (ref 27.0–33.0)
MCHC: 28.6 g/dL — ABNORMAL LOW (ref 32.0–36.0)
MCV: 79.4 fL — ABNORMAL LOW (ref 80.0–100.0)
MPV: 10.8 fL (ref 7.5–12.5)
Platelets: 264 10*3/uL (ref 140–400)
RBC: 3.88 10*6/uL (ref 3.80–5.10)
WBC: 8.1 10*3/uL (ref 3.8–10.8)

## 2018-09-11 ENCOUNTER — Ambulatory Visit: Payer: Self-pay | Admitting: Gastroenterology

## 2018-09-11 ENCOUNTER — Other Ambulatory Visit: Payer: Self-pay

## 2018-09-11 ENCOUNTER — Encounter: Payer: Self-pay | Admitting: Gastroenterology

## 2018-09-11 VITALS — BP 140/80 | HR 100 | Temp 98.1°F | Ht 66.0 in | Wt 228.0 lb

## 2018-09-11 DIAGNOSIS — D509 Iron deficiency anemia, unspecified: Secondary | ICD-10-CM

## 2018-09-11 NOTE — Progress Notes (Signed)
Chief Complaint: Symptomatic anemia, IDA  Referring Provider:     Emeterio Reeve, DO   HPI:    Margaret Gray is a 58 y.o. wheelchair-bound female with a history of arthritis, hyperlipidemia, hypertension, referred to the Gastroenterology Clinic for evaluation of symptomatic anemia.  She was recently admitted 8/14-16 with symptomatic anemia (SOB, palpitations, lightheadedness, fatigue), with hemoglobin 3.2, MCV 62.  Admits to ibuprofen 600 mg regularly prior to admission.  Treated with 4U PRBCs.  Iron 11, TIBC 587, ferritin 2.  B12 300.  Treated with IV iron and B12.  No inpatient GI consult obtained.  Was referred to GI and Hematology as outpatient.  Was discharged with hemoglobin 7.4.  Was seen by her PCM, Dr. Sheppard Coil in follow-up.  Repeat labs on 8/20 n/f H/H8 0.8/30.8, MCV 79.4, iron 127, TIBC 490, sat 26%, ferritin 29, normal CMP.  Continued iron supplement with plan to repeat labs in 6 to 8 weeks.  No longer taking any NSAIDs.  Today states she feels near her baseline.  No hematochezia, melena, nausea, vomiting, fever, chills, night sweats, weight loss.  Good p.o. intake.  Endorses diet rich in iron.  No previous EGD or colonoscopy.  No known family history of GI malignancy, IBD.  Past Medical History:  Diagnosis Date  . Arthritis   . Gallstones   . History of blood transfusion   . Hyperlipidemia   . Hypertension    "10years ago" as of 08/25/18  . UTI (urinary tract infection)      Past Surgical History:  Procedure Laterality Date  . APPENDECTOMY  1989  . CESAREAN SECTION    . CHOLECYSTECTOMY  1989  . TOTAL HIP ARTHROPLASTY Right 2009   Family History  Problem Relation Age of Onset  . Cancer Father        kidney  . Bladder Cancer Father        and mentioned it spreaded to bone as well  . Ulcerative colitis Mother   . Colon cancer Neg Hx   . Esophageal cancer Neg Hx    Social History   Tobacco Use  . Smoking status: Never Smoker  . Smokeless  tobacco: Never Used  Substance Use Topics  . Alcohol use: Never    Frequency: Never  . Drug use: Never   Current Outpatient Medications  Medication Sig Dispense Refill  . ferrous sulfate (FEOSOL) 325 (65 FE) MG tablet Take 1 tablet (325 mg total) by mouth 3 (three) times daily with meals. 90 tablet 3  . pantoprazole (PROTONIX) 40 MG tablet Take 1 tablet (40 mg total) by mouth 2 (two) times daily. 60 tablet 0  . vitamin B-12 (CYANOCOBALAMIN) 1000 MCG tablet Take 1 tablet (1,000 mcg total) by mouth daily. 30 tablet 1  . Acetaminophen (TYLENOL PO) Take 1 tablet by mouth as needed.     No current facility-administered medications for this visit.    Allergies  Allergen Reactions  . Penicillins Rash    Rash on legs Did it involve swelling of the face/tongue/throat, SOB, or low BP? No Did it involve sudden or severe rash/hives, skin peeling, or any reaction on the inside of your mouth or nose? Yes Did you need to seek medical attention at a hospital or doctor's office? No When did it last happen?58 yrs old If all above answers are "NO", may proceed with cephalosporin use.  . Sulfa Antibiotics Rash  . Tape Itching and Rash  Reaction to surgical tape     Review of Systems: All systems reviewed and negative except where noted in HPI.     Physical Exam:    Wt Readings from Last 3 Encounters:  09/11/18 228 lb (103.4 kg)  08/31/18 228 lb 3.2 oz (103.5 kg)  08/27/18 223 lb 3.2 oz (101.2 kg)    BP 140/80   Pulse 100   Temp 98.1 F (36.7 C)   Ht 5\' 6"  (1.676 m)   Wt 228 lb (103.4 kg)   BMI 36.80 kg/m  Constitutional:  Pleasant, in no acute distress.  Sitting in wheelchair. Psychiatric: Normal mood and affect. Behavior is normal. EENT: Pupils normal.  Conjunctivae are normal. No scleral icterus. Neck supple. No cervical LAD. Cardiovascular: Normal rate, regular rhythm. No edema Pulmonary/chest: Effort normal and breath sounds normal. No wheezing, rales or rhonchi.  Abdominal: Soft, nondistended, nontender. Bowel sounds active throughout. There are no masses palpable. No hepatomegaly. Neurological: Alert and oriented to person place and time. Skin: Skin is warm and dry. No rashes noted.   ASSESSMENT AND PLAN;   1) Iron deficiency anemia Margaret Gray is a 58 y.o. female presenting with recent admission for profound IDA.  Transfused 4 units PRBCs, started on oral iron supplementation, with improvement.  Otherwise, without overt GI blood loss.  Discussed pathophysiology and etiologies for IDA at length today and will proceed as below:  -Check vitamin D for co-malabsorption - EGD with duodenal biopsies -Colonoscopy -If endoscopic evaluation unrevealing, plan for VCE for small bowel interrogation - She is currently applying for change to her insurance (Medicare A currently), and requesting to have the procedure done early October.  I told her that I would not delay any further than 1 month for these procedures given the degree of her IDA and elevated clinical concerns.  She understands, but is worried about pending bill from her recent hospitalization (still does not have a bill), husband's medical expenses.  Can also explore additional payment avenues through the clinic, but she would like to look into the insurance route first.  Either way, she will schedule EGD/colonoscopy to be done in early October. -Resume iron supplementation -Repeat CBC and iron panel in 3 months - Okay to resume PPI therapy for reflux if she needs.  Otherwise she is without active reflux symptoms.  The indications, risks, and benefits of EGD and colonoscopy were explained to the patient in detail. Risks include but are not limited to bleeding, perforation, adverse reaction to medications, and cardiopulmonary compromise. Sequelae include but are not limited to the possibility of surgery, hositalization, and mortality. The patient verbalized understanding and wished to proceed. All  questions answered, referred to scheduler and bowel prep ordered. Further recommendations pending results of the exam.    Lavena Bullion, DO, FACG  09/11/2018, 3:28 PM   Emeterio Reeve, DO

## 2018-09-11 NOTE — Patient Instructions (Signed)
If you are age 56 or older, your body mass index should be between 23-30. Your Body mass index is 36.8 kg/m. If this is out of the aforementioned range listed, please consider follow up with your Primary Care Provider.  If you are age 22 or younger, your body mass index should be between 19-25. Your Body mass index is 36.8 kg/m. If this is out of the aformentioned range listed, please consider follow up with your Primary Care Provider.   You have been scheduled for an endoscopy and colonoscopy. Please follow the written instructions given to you at your visit today. Please pick up your prep supplies at the pharmacy within the next 1-3 days. If you use inhalers (even only as needed), please bring them with you on the day of your procedure. Your physician has requested that you go to www.startemmi.com and enter the access code given to you at your visit today. This web site gives a general overview about your procedure. However, you should still follow specific instructions given to you by our office regarding your preparation for the procedure.  Please go to the lab at [facility] ([place] You will need to go to level "B", you do not need an appointment for this. Hours available are 7:30 am - 4:30 pm.   Thank you,  Dr. Jackquline Denmark

## 2018-09-19 ENCOUNTER — Ambulatory Visit: Payer: Self-pay | Admitting: Osteopathic Medicine

## 2018-09-19 ENCOUNTER — Ambulatory Visit (INDEPENDENT_AMBULATORY_CARE_PROVIDER_SITE_OTHER): Payer: Self-pay | Admitting: Osteopathic Medicine

## 2018-09-19 ENCOUNTER — Encounter: Payer: Self-pay | Admitting: Osteopathic Medicine

## 2018-09-19 ENCOUNTER — Other Ambulatory Visit: Payer: Self-pay

## 2018-09-19 VITALS — BP 146/92 | HR 87 | Temp 98.3°F | Wt 229.2 lb

## 2018-09-19 DIAGNOSIS — D649 Anemia, unspecified: Secondary | ICD-10-CM

## 2018-09-19 DIAGNOSIS — R030 Elevated blood-pressure reading, without diagnosis of hypertension: Secondary | ICD-10-CM

## 2018-09-19 DIAGNOSIS — E611 Iron deficiency: Secondary | ICD-10-CM

## 2018-09-19 DIAGNOSIS — B369 Superficial mycosis, unspecified: Secondary | ICD-10-CM

## 2018-09-19 MED ORDER — NYSTATIN 100000 UNIT/GM EX POWD: Freq: Three times a day (TID) | CUTANEOUS | 11 refills | Status: DC

## 2018-09-19 NOTE — Progress Notes (Signed)
HPI: Margaret Gray is a 58 y.o. female who  has a past medical history of Arthritis, Gallstones, History of blood transfusion, Hyperlipidemia, Hypertension, and UTI (urinary tract infection).  she presents to [facility] today, 09/19/18,  for chief complaint of:  Follow up BP Follow up cellulitis Follow up anemia   Anemia: Recently seen by GI 09/11/18, planning for EGD, Colonoscopy, consider VCE if needed. Pt requesting these be done in early October given concerns of finances/insurance (currentlyscheduled for 10/19/18)   HTN: BP in intake: 146/92 Home BP monitor: 146/102 Close enough!  Home readings: 107/76 - 139/90 BP Readings from Last 3 Encounters:  09/19/18 (!) 146/92  09/11/18 140/80  08/31/18 (!) 170/92   Skin: Treated for mild cellulitis of hand from IV (infiltrated, skin was reddened and sore at last visit). Reports worsening yeast infection of skin since then beneath pannus.        At today's visit 09/19/18 ... PMH, PSH, FH reviewed and updated as needed.  Current medication list and allergy/intolerance hx reviewed and updated as needed. (See remainder of HPI, ROS, Phys Exam below)   No results found.  No results found for this or any previous visit (from the past 72 hour(s)).        ASSESSMENT/PLAN: The primary encounter diagnosis was Symptomatic anemia. Diagnoses of Iron deficiency, Fungal skin infection, and White coat syndrome without diagnosis of hypertension were also pertinent to this visit.   Orders Placed This Encounter  Procedures  . CBC  . Fe+TIBC+Fer  . VITAMIN D 25 Hydroxy (Vit-D Deficiency, Fractures)     Meds ordered this encounter  Medications  . nystatin (MYCOSTATIN/NYSTOP) powder    Sig: Apply topically 3 (three) times daily.    Dispense:  60 g    Refill:  11    Follow-up plan: Return in about 4 weeks (around 10/17/2018) for LAB VISIT ONLY. Follow up with Dr Sheppard Coil for annual in 6  months unless you need me sooner! .                                                 ################################################# ################################################# ################################################# #################################################    No outpatient medications have been marked as taking for the 09/19/18 encounter (Office Visit) with Emeterio Reeve, DO.    Allergies  Allergen Reactions  . Penicillins Rash    Rash on legs Did it involve swelling of the face/tongue/throat, SOB, or low BP? No Did it involve sudden or severe rash/hives, skin peeling, or any reaction on the inside of your mouth or nose? Yes Did you need to seek medical attention at a hospital or doctor's office? No When did it last happen?58 yrs old If all above answers are "NO", may proceed with cephalosporin use.  . Sulfa Antibiotics Rash  . Tape Itching and Rash    Reaction to surgical tape       Review of Systems:  Constitutional: No recent illness  HEENT: No  headache, no vision change  Cardiac: No  chest pain, No  pressure, No palpitations  Respiratory:  No  shortness of breath. No  Cough  Gastrointestinal: No  abdominal pain  Musculoskeletal: No new myalgia/arthralgia  Skin: +Rash  Neurologic: No  weakness, No  Dizziness   Exam:  BP (!) 146/92 (BP Location: Left Arm, Patient Position: Sitting, Cuff Size: Large)  Pulse 87   Temp 98.3 F (36.8 C) (Oral)   Wt 229 lb 3.2 oz (104 kg)   BMI 36.99 kg/m   Constitutional: VS see above. General Appearance: alert, well-developed, well-nourished, NAD  Eyes: Normal lids and conjunctive, non-icteric sclera  Neck: No masses, trachea midline.   Respiratory: Normal respiratory effort. no wheeze, no rhonchi, no rales  Cardiovascular: S1/S2 normal, no murmur, no rub/gallop auscultated. RRR.   Musculoskeletal: Gait normal. Symmetric and  independent movement of all extremities  Neurological: Normal balance/coordination. No tremor.  Skin: warm, dry, intact. Red tinea rash under abdominal pannus, no ulceration   Psychiatric: Normal judgment/insight. Normal mood and affect. Oriented x3.       Visit summary with medication list and pertinent instructions was printed for patient to review, patient was advised to alert [place] if any updates are needed. All questions at time of visit were answered - patient instructed to contact office with any additional concerns. ER/RTC precautions were reviewed with the patient and understanding verbalized.   Note: Total time spent 25 minutes, greater than 50% of the visit was spent face-to-face counseling and coordinating care for the following: The primary encounter diagnosis was Symptomatic anemia. Diagnoses of Iron deficiency and Fungal skin infection were also pertinent to this visit.Marland Kitchen  Please note: voice recognition software was used to produce this document, and typos may escape review. Please contact Dr. Sheppard Coil for any needed clarifications.    Follow up plan: Return in about 4 weeks (around 10/17/2018) for LAB VISIT ONLY. Follow up with Dr Sheppard Coil for annual in 6 months unless you need me sooner! Marland Kitchen

## 2018-09-27 ENCOUNTER — Other Ambulatory Visit: Payer: Self-pay | Admitting: Osteopathic Medicine

## 2018-09-27 NOTE — Telephone Encounter (Signed)
[facility] pharmacy requesting med refills for doxycycline. Rx not listed in active med list. Pls advise, thanks.

## 2018-09-27 NOTE — Telephone Encounter (Signed)
No refills for antibiotics, if concern for infection needs visit or urgent care.

## 2018-10-18 ENCOUNTER — Telehealth: Payer: Self-pay

## 2018-10-18 LAB — IRON,TIBC AND FERRITIN PANEL
%SAT: 28 % (calc) (ref 16–45)
Ferritin: 30 ng/mL (ref 16–232)
Iron: 99 ug/dL (ref 45–160)
TIBC: 354 mcg/dL (calc) (ref 250–450)

## 2018-10-18 LAB — CBC
HCT: 46.9 % — ABNORMAL HIGH (ref 35.0–45.0)
Hemoglobin: 14.8 g/dL (ref 11.7–15.5)
MCH: 27.4 pg (ref 27.0–33.0)
MCHC: 31.6 g/dL — ABNORMAL LOW (ref 32.0–36.0)
MCV: 86.7 fL (ref 80.0–100.0)
Platelets: 306 10*3/uL (ref 140–400)
RBC: 5.41 10*6/uL — ABNORMAL HIGH (ref 3.80–5.10)
RDW: 15.8 % — ABNORMAL HIGH (ref 11.0–15.0)
WBC: 6.1 10*3/uL (ref 3.8–10.8)

## 2018-10-18 LAB — VITAMIN D 25 HYDROXY (VIT D DEFICIENCY, FRACTURES): Vit D, 25-Hydroxy: 9 ng/mL — ABNORMAL LOW (ref 30–100)

## 2018-10-18 MED ORDER — VITAMIN D (ERGOCALCIFEROL) 1.25 MG (50000 UNIT) PO CAPS: 50000.0000 [IU] | ORAL_CAPSULE | ORAL | 0 refills | Status: DC

## 2018-10-18 NOTE — Addendum Note (Signed)
Addended by: Maryla Morrow on: 10/18/2018 04:59 PM   Modules accepted: Orders

## 2018-10-18 NOTE — Telephone Encounter (Signed)
Pt returned call and answered “No” to all questions.  ° °

## 2018-10-18 NOTE — Telephone Encounter (Signed)
Covid-19 screening questions   Do you now or have you had a fever in the last 14 days?  Do you have any respiratory symptoms of shortness of breath or cough now or in the last 14 days?  Do you have any family members or close contacts with diagnosed or suspected Covid-19 in the past 14 days?  Have you been tested for Covid-19 and found to be positive?       

## 2018-10-19 ENCOUNTER — Ambulatory Visit (AMBULATORY_SURGERY_CENTER): Payer: Self-pay | Admitting: Gastroenterology

## 2018-10-19 ENCOUNTER — Encounter: Payer: Self-pay | Admitting: Gastroenterology

## 2018-10-19 ENCOUNTER — Other Ambulatory Visit: Payer: Self-pay

## 2018-10-19 VITALS — BP 158/93 | HR 66 | Temp 97.6°F | Resp 16 | Ht 66.0 in | Wt 228.0 lb

## 2018-10-19 DIAGNOSIS — K2100 Gastro-esophageal reflux disease with esophagitis, without bleeding: Secondary | ICD-10-CM

## 2018-10-19 DIAGNOSIS — D123 Benign neoplasm of transverse colon: Secondary | ICD-10-CM

## 2018-10-19 DIAGNOSIS — K5730 Diverticulosis of large intestine without perforation or abscess without bleeding: Secondary | ICD-10-CM

## 2018-10-19 DIAGNOSIS — K449 Diaphragmatic hernia without obstruction or gangrene: Secondary | ICD-10-CM

## 2018-10-19 DIAGNOSIS — D122 Benign neoplasm of ascending colon: Secondary | ICD-10-CM

## 2018-10-19 DIAGNOSIS — D509 Iron deficiency anemia, unspecified: Secondary | ICD-10-CM

## 2018-10-19 DIAGNOSIS — K635 Polyp of colon: Secondary | ICD-10-CM

## 2018-10-19 MED ORDER — PANTOPRAZOLE SODIUM 40 MG PO TBEC: 40.0000 mg | DELAYED_RELEASE_TABLET | Freq: Two times a day (BID) | ORAL | 3 refills | Status: DC

## 2018-10-19 MED ORDER — SODIUM CHLORIDE 0.9 % IV SOLN: 500.0000 mL | Freq: Once | INTRAVENOUS | Status: DC

## 2018-10-19 NOTE — Progress Notes (Signed)
Called to room to assist during endoscopic procedure.  Patient ID and intended procedure confirmed with present staff. Received instructions for my participation in the procedure from the performing physician.  

## 2018-10-19 NOTE — Op Note (Signed)
[place] Patient Name: Margaret Gray [facility] Procedure Date: 10/19/2018 7:38 AM MRN: [id number] Endoscopist: Gerrit Heck , MD Age: 58 Referring MD:  Date of Birth: [birth year] Gender: Female Account #: [id number] Procedure:                Upper GI endoscopy Indications:              Iron deficiency anemia, Vitamin D deficiency                           58 yo female admitted 8/14?"16 with symptomatic                            anemia (SOB, palpitations, lightheadedness,                            fatigue), with hemoglobin 3.2, MCV 62. Had been                            taking ibuprofen 600 mg regularly prior to                            admission. Treated with 4U PRBCs. Iron 11, TIBC                            587, ferritin 2. B12 300. Treated with IV iron and                            B12. No overt GI blood loss. She has since resumed                            oral iron therapy, with repeat labs last week                            notable for: H/H 14.8/46.9, MCV 87, iron 99, TIBC                            354, iron sat 28%, ferritin 30. Also with vitamin D                            deficiency (9). Medicines:                Monitored Anesthesia Care Procedure:                Pre-Anesthesia Assessment:                           - Prior to the procedure, a History and Physical                            was performed, and patient medications and                            allergies were reviewed. The patient's tolerance of  previous anesthesia was also reviewed. The risks                            and benefits of the procedure and the sedation                            options and risks were discussed with the patient.                            All questions were answered, and informed consent                            was obtained. Prior Anticoagulants: The patient has                            taken no previous anticoagulant or  antiplatelet                            agents. ASA Grade Assessment: II - A patient with                            mild systemic disease. After reviewing the risks                            and benefits, the patient was deemed in                            satisfactory condition to undergo the procedure.                           After obtaining informed consent, the endoscope was                            passed under direct vision. Throughout the                            procedure, the patient's blood pressure, pulse, and                            oxygen saturations were monitored continuously. The                            Endoscope was introduced through the mouth, and                            advanced to the second part of duodenum. The upper                            GI endoscopy was accomplished without difficulty.                            The patient tolerated the procedure well. Scope In: Scope Out: Findings:  Esophagogastric landmarks were identified: the                            Z-line was found at 32 cm, the gastroesophageal                            junction was found at 32 cm and the site of hiatal                            narrowing was found at 39 cm from the incisors.                           A 7 cm hiatal hernia was present. No Camerons                            ulcers, erosions, erythema noted on anterograde or                            retroflexed views.                           LA Grade A (one or more mucosal breaks less than 5                            mm, not extending between tops of 2 mucosal folds)                            esophagitis with no bleeding was found 32 cm from                            the incisors.                           The upper third of the esophagus and middle third                            of the esophagus were normal.                           Mild luminal distortion in the pre-pyloric antrum,                             suggestive of prior healed ulcer. The mucosa was                            otherwise normal appearing. The remainder of the                            stomach was normal. Biopsies were taken with a cold                            forceps for Helicobacter pylori testing. Estimated  blood loss was minimal.                           Localized mildly erythematous mucosa without active                            bleeding and with no stigmata of bleeding was found                            in the duodenal bulb. Biopsies were taken with a                            cold forceps for histology. Estimated blood loss                            was minimal.                           The second portion of the duodenum was normal.                            Biopsies for histology were taken with a cold                            forceps for evaluation of celiac disease. Estimated                            blood loss was minimal. Complications:            No immediate complications. Estimated Blood Loss:     Estimated blood loss was minimal. Impression:               - Esophagogastric landmarks identified.                           - 7 cm hiatal hernia.                           - LA Grade A reflux esophagitis.                           - Normal upper third of esophagus and middle third                            of esophagus.                           - Normal stomach. Biopsied.                           - Erythematous duodenopathy. Biopsied.                           - Normal second portion of the duodenum. Biopsied. Recommendation:           - Patient has a contact number available for  emergencies. The signs and symptoms of potential                            delayed complications were discussed with the                            patient. Return to normal activities tomorrow.                            Written discharge  instructions were provided to the                            patient.                           - Resume previous diet.                           - Continue present medications.                           - Await pathology results.                           - Return to GI clinic in 2 months.                           - Resume Protonix 40 mg PO twice daily for another                            4 weeks, then reduce to 40 mg/day for ongoing                            control of reflux.                           - Colonoscopy today.                           - Repeat CBC and iron panel in 3 months. If still                            normal, can liberalize iron therapy and continue to                            monitor. If no recurrence of IDA, then can hold off                            on further small bowel interrogation with VCE. Gerrit Heck, MD 10/19/2018 8:49:40 AM

## 2018-10-19 NOTE — Op Note (Signed)
[name] [facility] Procedure Date: 10/19/2018 7:38 AM MRN: [id number] Endoscopist: Gerrit Heck , MD Age: 58 Referring MD:  Date of Birth: 08-15-1960 Gender: Female Account #: [id number] Procedure:                Colonoscopy Indications:              Iron deficiency anemia                           58 yo female admitted 8/14-16 with symptomatic                            anemia (SOB, palpitations, lightheadedness,                            fatigue), with hemoglobin 3.2, MCV 62. Had been                            taking ibuprofen 600 mg regularly prior to                            admission. Treated with 4U PRBCs. Iron 11, TIBC                            587, ferritin 2. B12 300. Treated with IV iron and                            B12. No overt GI blood loss. She has since resumed                            oral iron therapy, with repeat labs last week                            notable for: H/H 14.8/46.9, MCV 87, iron 99, TIBC                            354, iron sat 28%, ferritin 30. Also with vitamin D                            deficiency (9). No prior EGD or colonoscopy. Medicines:                Monitored Anesthesia Care Procedure:                Pre-Anesthesia Assessment:                           - Prior to the procedure, a History and Physical                            was performed, and patient medications and                            allergies were reviewed. The patient's tolerance of  previous anesthesia was also reviewed. The risks                            and benefits of the procedure and the sedation                            options and risks were discussed with the patient.                            All questions were answered, and informed consent                            was obtained. Prior Anticoagulants: The patient has                            taken no previous anticoagulant or antiplatelet                             agents. ASA Grade Assessment: II - A patient with                            mild systemic disease. After reviewing the risks                            and benefits, the patient was deemed in                            satisfactory condition to undergo the procedure.                           After obtaining informed consent, the colonoscope                            was passed under direct vision. Throughout the                            procedure, the patient's blood pressure, pulse, and                            oxygen saturations were monitored continuously. The                            Colonoscope was introduced through the anus and                            advanced to the the terminal ileum. The colonoscopy                            was performed without difficulty. The patient                            tolerated the procedure well. The quality of the  bowel preparation was adequate. The terminal ileum,                            ileocecal valve, appendiceal orifice, and rectum                            were photographed. Scope In: 8:14:43 AM Scope Out: 8:37:54 AM Scope Withdrawal Time: 0 hours 14 minutes 26 seconds  Total Procedure Duration: 0 hours 23 minutes 11 seconds  Findings:                 The perianal and digital rectal examinations were                            normal.                           Two sessile polyps were found in the hepatic                            flexure and ascending colon. The polyps were 3 to 4                            mm in size. These polyps were removed with a cold                            snare. Resection and retrieval were complete.                            Estimated blood loss was minimal.                           Multiple small and large-mouthed diverticula were                            found in the sigmoid colon.                           The sigmoid colon was  moderately tortuous.                            Advancing the scope required using manual pressure.                           Anal papilla(e) were hypertrophied.                           The retroflexed view of the distal rectum and anal                            verge was otherwise normal.                           The terminal ileum appeared normal. Complications:            No immediate complications. Estimated Blood Loss:  Estimated blood loss was minimal. Estimated blood                            loss was minimal. Impression:               - Two 3 to 4 mm polyps at the hepatic flexure and                            in the ascending colon, removed with a cold snare.                            Resected and retrieved.                           - Diverticulosis in the sigmoid colon.                           - Tortuous colon.                           - The distal rectum and anal verge are normal on                            retroflexion view.                           - The examined portion of the ileum was normal. Recommendation:           - Patient has a contact number available for                            emergencies. The signs and symptoms of potential                            delayed complications were discussed with the                            patient. Return to normal activities tomorrow.                            Written discharge instructions were provided to the                            patient.                           - Resume previous diet.                           - Continue present medications.                           - Await pathology results.                           - Repeat colonoscopy in 7-10 years for surveillance  based on pathology results.                           - Return to GI clinic in 2 months. Gerrit Heck, MD 10/19/2018 8:54:47 AM

## 2018-10-19 NOTE — Patient Instructions (Signed)
RETURN TO GI CLINIC IN 2 MONTHS  REPEAT CBC AND IRON PANEL IN 3 MONTHS  RESUME PROTONIX 40 MG TWICE DAILY FOR ANOTHER 4 WEEKS, THEN REDUCE TO PROTONIX 40 MG ONCE DAILY.  HANDOUTS GIVEN :POLYPS, DIVERTICULOSIS AND ESOPHAGITIS     YOU HAD AN ENDOSCOPIC PROCEDURE TODAY AT [facility]:   Refer to the procedure report that was given to you for any specific questions about what was found during the examination.  If the procedure report does not answer your questions, please call your gastroenterologist to clarify.  If you requested that your care partner not be given the details of your procedure findings, then the procedure report has been included in a sealed envelope for you to review at your convenience later.  YOU SHOULD EXPECT: Some feelings of bloating in the abdomen. Passage of more gas than usual.  Walking can help get rid of the air that was put into your GI tract during the procedure and reduce the bloating. If you had a lower endoscopy (such as a colonoscopy or flexible sigmoidoscopy) you may notice spotting of blood in your stool or on the toilet paper. If you underwent a bowel prep for your procedure, you may not have a normal bowel movement for a few days.  Please Note:  You might notice some irritation and congestion in your nose or some drainage.  This is from the oxygen used during your procedure.  There is no need for concern and it should clear up in a day or so.  SYMPTOMS TO REPORT IMMEDIATELY:   Following lower endoscopy (colonoscopy or flexible sigmoidoscopy):  Excessive amounts of blood in the stool  Significant tenderness or worsening of abdominal pains  Swelling of the abdomen that is new, acute  Fever of 100F or higher   Following upper endoscopy (EGD)  Vomiting of blood or coffee ground material  New chest pain or pain under the shoulder blades  Painful or persistently difficult swallowing  New shortness of breath  Fever of 100F or  higher  Black, tarry-looking stools  For urgent or emergent issues, a gastroenterologist can be reached at any hour by calling [phone].   DIET:  We do recommend a small meal at first, but then you may proceed to your regular diet.  Drink plenty of fluids but you should avoid alcoholic beverages for 24 hours.  ACTIVITY:  You should plan to take it easy for the rest of today and you should NOT DRIVE or use heavy machinery until tomorrow (because of the sedation medicines used during the test).    FOLLOW UP: Our staff will call the number listed on your records 48-72 hours following your procedure to check on you and address any questions or concerns that you may have regarding the information given to you following your procedure. If we do not reach you, we will leave a message.  We will attempt to reach you two times.  During this call, we will ask if you have developed any symptoms of COVID 19. If you develop any symptoms (ie: fever, flu-like symptoms, shortness of breath, cough etc.) before then, please call [phone].  If you test positive for Covid 19 in the 2 weeks post procedure, please call and report this information to [place].    If any biopsies were taken you will be contacted by phone or by letter within the next 1-3 weeks.  Please call us at [phone] if you have not heard about the biopsies  in 3 weeks.    SIGNATURES/CONFIDENTIALITY: You and/or your care partner have signed paperwork which will be entered into your electronic medical record.  These signatures attest to the fact that that the information above on your After Visit Summary has been reviewed and is understood.  Full responsibility of the confidentiality of this discharge information lies with you and/or your care-partner.

## 2018-10-19 NOTE — Progress Notes (Signed)
PT taken to PACU. Monitors in place. VSS. Report given to RN. 

## 2018-10-23 ENCOUNTER — Telehealth: Payer: Self-pay | Admitting: *Deleted

## 2018-10-23 ENCOUNTER — Telehealth: Payer: Self-pay | Admitting: Gastroenterology

## 2018-10-23 NOTE — Telephone Encounter (Signed)
  Follow up Call-  Call back number 10/19/2018  Post procedure Call Back phone  # [phone]  Permission to leave phone message Yes     Patient questions:  Message left to call us if necessary.  Second call.

## 2018-10-23 NOTE — Telephone Encounter (Signed)
  Follow up Call-  Call back number 10/19/2018  Post procedure Call Back phone  # [phone]  Permission to leave phone message Yes     Patient questions:  Message left to call us if necessary.

## 2018-10-24 ENCOUNTER — Other Ambulatory Visit: Payer: Self-pay

## 2018-10-24 DIAGNOSIS — K2100 Gastro-esophageal reflux disease with esophagitis, without bleeding: Secondary | ICD-10-CM

## 2018-10-24 DIAGNOSIS — D509 Iron deficiency anemia, unspecified: Secondary | ICD-10-CM

## 2018-10-24 MED ORDER — PANTOPRAZOLE SODIUM 40 MG PO TBEC: 40.0000 mg | DELAYED_RELEASE_TABLET | Freq: Two times a day (BID) | ORAL | 3 refills | Status: DC

## 2018-10-26 ENCOUNTER — Other Ambulatory Visit: Payer: Self-pay

## 2018-10-26 DIAGNOSIS — D509 Iron deficiency anemia, unspecified: Secondary | ICD-10-CM

## 2018-12-19 ENCOUNTER — Other Ambulatory Visit: Payer: Self-pay | Admitting: Gastroenterology

## 2018-12-19 DIAGNOSIS — D509 Iron deficiency anemia, unspecified: Secondary | ICD-10-CM

## 2018-12-19 DIAGNOSIS — K2100 Gastro-esophageal reflux disease with esophagitis, without bleeding: Secondary | ICD-10-CM

## 2019-01-03 ENCOUNTER — Other Ambulatory Visit: Payer: Self-pay | Admitting: Osteopathic Medicine

## 2019-02-08 ENCOUNTER — Telehealth: Payer: Self-pay

## 2019-02-08 NOTE — Telephone Encounter (Signed)
-----   Message from Mohammed Kindle, RN sent at 10/26/2018  4:25 PM EDT ----- Regarding: f/u lab work Please notify the patient of need for repeat lab work-orders are already in [place];

## 2019-02-08 NOTE — Telephone Encounter (Signed)
Left message for patient to call back to the office concerning repeat lab work as requested by MD;

## 2019-02-09 NOTE — Telephone Encounter (Signed)
Left message for patient to call back to the office;  

## 2019-02-12 NOTE — Telephone Encounter (Signed)
Left message for patient to call back to the office;  

## 2019-02-13 NOTE — Telephone Encounter (Signed)
Left message for patient to call back to the office;  

## 2019-02-14 ENCOUNTER — Other Ambulatory Visit: Payer: Self-pay | Admitting: Gastroenterology

## 2019-02-14 DIAGNOSIS — D509 Iron deficiency anemia, unspecified: Secondary | ICD-10-CM

## 2019-02-14 DIAGNOSIS — K2100 Gastro-esophageal reflux disease with esophagitis, without bleeding: Secondary | ICD-10-CM

## 2019-10-29 ENCOUNTER — Other Ambulatory Visit: Payer: Self-pay | Admitting: Gastroenterology

## 2019-10-29 DIAGNOSIS — D509 Iron deficiency anemia, unspecified: Secondary | ICD-10-CM

## 2019-10-29 DIAGNOSIS — K2100 Gastro-esophageal reflux disease with esophagitis, without bleeding: Secondary | ICD-10-CM

## 2020-03-25 ENCOUNTER — Other Ambulatory Visit: Payer: Self-pay

## 2020-03-25 ENCOUNTER — Encounter: Payer: Self-pay | Admitting: Emergency Medicine

## 2020-03-25 ENCOUNTER — Telehealth: Payer: Self-pay

## 2020-03-25 ENCOUNTER — Emergency Department
Admission: EM | Admit: 2020-03-25 | Discharge: 2020-03-25 | Disposition: A | Discharge status: Home / Self Care | Payer: Medicare Other | Source: Home / Self Care | Attending: Family Medicine | Admitting: Family Medicine

## 2020-03-25 DIAGNOSIS — M79662 Pain in left lower leg: Secondary | ICD-10-CM | POA: Diagnosis not present

## 2020-03-25 DIAGNOSIS — M7989 Other specified soft tissue disorders: Secondary | ICD-10-CM | POA: Diagnosis not present

## 2020-03-25 NOTE — ED Notes (Signed)
Pt's left foot remains w/ +3 edema - toes are discolored - pt has ROM -left foot more swollen than right. Pt also thinks she had COVID approx 2 months ago (has not been vaccinated) but lost her sense of smell and taste at the time. Pt to return in am for an ultrasound. Pt was directed to Urgent Care at approx 1350 by PCP. Pt arrived at [facility] after 1930 and was made aware that ultrasound was not available after 4pm. Pt & s/o verbalized an understanding. Pt is no longer on BP meds per pt, does not check her BP at home at this time but has gained approx 20 lbs in the last few months & has had limited exercise since being ill approx 2 months ago per pt.

## 2020-03-25 NOTE — Telephone Encounter (Signed)
[facility] sent a message about swelling in leg and a possible clot. I called and left a message advising she to go to the urgent care today for evaluation. We do not have openings.

## 2020-03-25 NOTE — Discharge Instructions (Addendum)
If symptoms become significantly worse during the night, proceed to the local emergency room.

## 2020-03-25 NOTE — ED Triage Notes (Signed)
Increased edema to BLE x 2 weeks  Increased swelling to LLE - per pt edema on right is normal  Pt is concerned about a blood clot on left  Pt noted she had sciatic pain on left  Pt had a hip replacement on right 10 years ago Pt is due for bilat knee replacement  Pt thinks she had COVID 2 months ago, but did not do any testing  No COVID vaccine

## 2020-03-25 NOTE — ED Provider Notes (Signed)
[facility]    CSN: [id number] Arrival date & time: 03/25/20  1932      History   Chief Complaint Chief Complaint  Patient presents with  . Leg Swelling    bilateral    HPI Margaret Gray is a 60 y.o. female.   Patient has chronic lower leg edema/swelling.  During the past two week she has had increased pain/swelling of her left lower leg, especially ankle and foot.  She is concerned about possible DVT.  She denies shortness of breath and chest pain, and feels well otherwise.  She denies fevers, chills, and sweats.  The history is provided by the patient and the spouse.  Leg Pain Location:  Leg, ankle and foot Time since incident:  2 weeks Injury: no   Leg location:  L leg Ankle location:  L ankle Foot location:  L foot Pain details:    Quality:  Aching   Radiates to:  Does not radiate   Severity:  Mild   Onset quality:  Gradual   Duration:  2 weeks   Timing:  Constant   Progression:  Worsening Chronicity:  New Prior injury to area:  No Relieved by:  Nothing Worsened by:  Bearing weight Ineffective treatments:  Elevation Associated symptoms: decreased ROM and swelling   Associated symptoms: no fatigue, no fever and no numbness   Risk factors: obesity     Past Medical History:  Diagnosis Date  . Arthritis   . Gallstones   . GERD (gastroesophageal reflux disease)   . History of blood transfusion   . Hyperlipidemia   . Hypertension    "10years ago" as of 08/25/18  . UTI (urinary tract infection)     Patient Active Problem List   Diagnosis Date Noted  . Mixed hyperlipidemia 08/31/2018  . Essential hypertension 08/31/2018  . Primary osteoarthritis 08/31/2018  . Hypocalcemia 08/31/2018  . Hypoalbuminemia 08/31/2018  . B12 deficiency 08/31/2018  . Iron deficiency 08/31/2018  . Cardiomegaly 08/26/2018  . Lower extremity edema 08/26/2018  . Symptomatic anemia 08/25/2018    Past Surgical History:  Procedure Laterality Date  . APPENDECTOMY   1989  . CESAREAN SECTION    . CHOLECYSTECTOMY  1989  . HIP SURGERY    . TOTAL HIP ARTHROPLASTY Right 2009    OB History   No obstetric history on file.      Home Medications    Prior to Admission medications   Medication Sig Start Date End Date Taking? Authorizing Provider  B-12, METHYLCOBALAMIN, SL Place 5,000 mcg under the tongue.   Yes [provider]  ferrous sulfate 324 MG TBEC Take 324 mg by mouth.   Yes [provider]  omeprazole (PRILOSEC) 20 MG capsule Take 20 mg by mouth daily.   Yes [provider]  Acetaminophen (TYLENOL PO) Take 1 tablet by mouth as needed. Patient not taking: Reported on 03/25/2020    [provider]  ferrous sulfate (FEOSOL) 325 (65 FE) MG tablet Take 1 tablet (325 mg total) by mouth 3 (three) times daily with meals. Patient not taking: Reported on 03/25/2020 08/27/18   Monica Becton, MD  nystatin (MYCOSTATIN/NYSTOP) powder Apply topically 3 (three) times daily. Patient not taking: Reported on 03/25/2020 09/19/18   Emeterio Reeve, DO  pantoprazole (PROTONIX) 40 MG tablet TAKE 1 TABLET BY MOUTH TWICE A DAY Patient not taking: Reported on 03/25/2020 02/16/19   Cirigliano, Dominic Pea, DO  vitamin B-12 (CYANOCOBALAMIN) 1000 MCG tablet Take 1 tablet (1,000 mcg total)  by mouth daily. Patient not taking: Reported on 03/25/2020 08/27/18   Monica Becton, MD  Vitamin D, Ergocalciferol, (DRISDOL) 1.25 MG (50000 UT) CAPS capsule TAKE 1 CAPSULE BY MOUTH EVERY 7 DAYS. TAKE X 12 TOTAL DOSES, THEN START 2000 UNITS DAILY Patient not taking: Reported on 03/25/2020 01/03/19   Emeterio Reeve, DO    Family History Family History  Problem Relation Age of Onset  . Cancer Father        kidney  . Bladder Cancer Father        and mentioned it spreaded to bone as well  . Ulcerative colitis Mother   . Colon cancer Neg Hx   . Esophageal cancer Neg Hx     Social History Social History   Tobacco Use  . Smoking status: Never  Smoker  . Smokeless tobacco: Never Used  Vaping Use  . Vaping Use: Never used  Substance Use Topics  . Alcohol use: Never  . Drug use: Never     Allergies   Penicillins, Sulfa antibiotics, and Tape   Review of Systems Review of Systems  Constitutional: Negative for chills, diaphoresis, fatigue and fever.  Respiratory: Negative for cough, chest tightness and shortness of breath.   Cardiovascular: Positive for leg swelling. Negative for chest pain.  Skin: Negative for color change and rash.  All other systems reviewed and are negative.    Physical Exam Triage Vital Signs ED Triage Vitals  Enc Vitals Group     BP 03/25/20 2016 (!) 165/100     Pulse Rate 03/25/20 2016 (!) 102     Resp 03/25/20 2016 17     Temp 03/25/20 2016 99 F (37.2 C)     Temp Source 03/25/20 2016 Oral     SpO2 03/25/20 2016 97 %     Weight 03/25/20 2025 250 lb (113.4 kg)     Height 03/25/20 2025 5\' 6"  (1.676 m)     Head Circumference --      Peak Flow --      Pain Score 03/25/20 2024 4     Pain Loc --      Pain Edu? --      Excl. in [place]? --    No data found.  Updated Vital Signs BP (!) 165/100 (BP Location: Left Arm)   Pulse (!) 102   Temp 99 F (37.2 C) (Oral)   Resp 17   Ht 5\' 6"  (1.676 m)   Wt 113.4 kg Comment: unable to weigh in w/c  SpO2 97%   BMI 40.35 kg/m   Visual Acuity Right Eye Distance:   Left Eye Distance:   Bilateral Distance:    Right Eye Near:   Left Eye Near:    Bilateral Near:     Physical Exam Vitals and nursing note reviewed.  Constitutional:      General: She is not in acute distress.    Appearance: She is obese.  HENT:     Head: Normocephalic.  Eyes:     Pupils: Pupils are equal, round, and reactive to light.  Cardiovascular:     Rate and Rhythm: Tachycardia present.     Heart sounds: Normal heart sounds.  Pulmonary:     Breath sounds: Normal breath sounds.  Musculoskeletal:        General: Tenderness present.     Right lower leg: Edema present.      Left lower leg: Edema present.       Legs:     Comments: Bilateral lower  leg edema present.  Left lower leg below the knee is more swollen than right.  There is tenderness to palpation over left lower posterior calf.  No erythema or warmth.  Left pedal pulses difficult to palpate; decreased cap refill left foot.  Skin:    General: Skin is warm and dry.  Neurological:     Mental Status: She is alert and oriented to person, place, and time.      UC Treatments / Results  Labs (all labs ordered are listed, but only abnormal results are displayed) Labs Reviewed - No data to display  EKG   Radiology No results found.  Procedures Procedures (including critical care time)  Medications Ordered in UC Medications - No data to display  Initial Impression / Assessment and Plan / UC Course  I have reviewed the triage vital signs and the nursing notes.  Pertinent labs & imaging results that were available during my care of the patient were reviewed by me and considered in my medical decision making (see chart for details).    Schedule venous US left lower extremity tomorrow.  Patient to call in morning for time of scan.  Final Clinical Impressions(s) / UC Diagnoses   Final diagnoses:  Pain and swelling of left lower leg     Discharge Instructions     If symptoms become significantly worse during the night, proceed to the local emergency room.    ED Prescriptions    None        Kandra Nicolas, MD 03/26/20 [phone]

## 2020-03-26 ENCOUNTER — Telehealth: Payer: Self-pay | Admitting: Family Medicine

## 2020-03-26 ENCOUNTER — Telehealth: Payer: Self-pay

## 2020-03-26 ENCOUNTER — Ambulatory Visit (INDEPENDENT_AMBULATORY_CARE_PROVIDER_SITE_OTHER): Payer: Medicare Other

## 2020-03-26 DIAGNOSIS — M7989 Other specified soft tissue disorders: Secondary | ICD-10-CM

## 2020-03-26 DIAGNOSIS — M79605 Pain in left leg: Secondary | ICD-10-CM | POA: Diagnosis not present

## 2020-03-26 MED ORDER — CELECOXIB 200 MG PO CAPS: 200.0000 mg | ORAL_CAPSULE | Freq: Every day | ORAL | 0 refills | Status: DC

## 2020-03-26 NOTE — Telephone Encounter (Signed)
Ordered by Dr Assunta Found yesterday, ordered for today

## 2020-03-26 NOTE — Telephone Encounter (Signed)
Pt returning this am for follow up US to r/o DVT. Dr Meda Coffee ordered US as outpatient and giving results to patient after study is over.

## 2020-03-26 NOTE — Telephone Encounter (Signed)
Patient had a medical visit yesterday with Dr. Assunta Found.  She was noted to have a swollen left leg.  Ultrasound was ordered.  This was performed today.  I discussed the test result with patient in person.  She still has some swelling and pain.  She has a superficial thrombophlebitis.  Management is discussed.  Follow-up with PCP.  Prescribed Celebrex, instructions to take daily for 1 week then as needed afterwards.  Must remain on omeprazole.  Be alert for any stomach distress

## 2020-05-06 ENCOUNTER — Encounter (HOSPITAL_COMMUNITY): Payer: Self-pay | Admitting: Emergency Medicine

## 2020-05-06 ENCOUNTER — Emergency Department
Admission: EM | Admit: 2020-05-06 | Discharge: 2020-05-06 | Disposition: A | Discharge status: Home / Self Care | Payer: Medicare Other | Source: Home / Self Care

## 2020-05-06 ENCOUNTER — Emergency Department (HOSPITAL_COMMUNITY)
Admission: EM | Admit: 2020-05-06 | Discharge: 2020-05-06 | Disposition: A | Discharge status: Home / Self Care | Payer: Medicare Other | Attending: Emergency Medicine | Admitting: Emergency Medicine

## 2020-05-06 ENCOUNTER — Other Ambulatory Visit: Payer: Self-pay

## 2020-05-06 ENCOUNTER — Emergency Department (HOSPITAL_COMMUNITY): Payer: Medicare Other

## 2020-05-06 DIAGNOSIS — R1032 Left lower quadrant pain: Secondary | ICD-10-CM | POA: Diagnosis not present

## 2020-05-06 DIAGNOSIS — Z96641 Presence of right artificial hip joint: Secondary | ICD-10-CM | POA: Diagnosis not present

## 2020-05-06 DIAGNOSIS — E669 Obesity, unspecified: Secondary | ICD-10-CM | POA: Diagnosis not present

## 2020-05-06 DIAGNOSIS — I10 Essential (primary) hypertension: Secondary | ICD-10-CM | POA: Diagnosis not present

## 2020-05-06 DIAGNOSIS — N201 Calculus of ureter: Secondary | ICD-10-CM

## 2020-05-06 DIAGNOSIS — R1111 Vomiting without nausea: Secondary | ICD-10-CM

## 2020-05-06 LAB — CBC WITH DIFFERENTIAL/PLATELET
Abs Immature Granulocytes: 0.12 10*3/uL — ABNORMAL HIGH (ref 0.00–0.07)
Basophils Absolute: 0.1 10*3/uL (ref 0.0–0.1)
Basophils Relative: 0 %
Eosinophils Absolute: 0 10*3/uL (ref 0.0–0.5)
Eosinophils Relative: 0 %
HCT: 39.1 % (ref 36.0–46.0)
Hemoglobin: 11.4 g/dL — ABNORMAL LOW (ref 12.0–15.0)
Immature Granulocytes: 1 %
Lymphocytes Relative: 5 %
Lymphs Abs: 0.8 10*3/uL (ref 0.7–4.0)
MCH: 25 pg — ABNORMAL LOW (ref 26.0–34.0)
MCHC: 29.2 g/dL — ABNORMAL LOW (ref 30.0–36.0)
MCV: 85.7 fL (ref 80.0–100.0)
Monocytes Absolute: 1.4 10*3/uL — ABNORMAL HIGH (ref 0.1–1.0)
Monocytes Relative: 8 %
Neutro Abs: 15 10*3/uL — ABNORMAL HIGH (ref 1.7–7.7)
Neutrophils Relative %: 86 %
Platelets: 336 10*3/uL (ref 150–400)
RBC: 4.56 MIL/uL (ref 3.87–5.11)
RDW: 15.6 % — ABNORMAL HIGH (ref 11.5–15.5)
WBC: 17.3 10*3/uL — ABNORMAL HIGH (ref 4.0–10.5)
nRBC: 0 % (ref 0.0–0.2)

## 2020-05-06 LAB — URINALYSIS, ROUTINE W REFLEX MICROSCOPIC
Bilirubin Urine: NEGATIVE
Glucose, UA: NEGATIVE mg/dL
Hgb urine dipstick: NEGATIVE
Ketones, ur: NEGATIVE mg/dL
Leukocytes,Ua: NEGATIVE
Nitrite: NEGATIVE
Protein, ur: NEGATIVE mg/dL
Specific Gravity, Urine: 1.014 (ref 1.005–1.030)
pH: 9 — ABNORMAL HIGH (ref 5.0–8.0)

## 2020-05-06 LAB — COMPREHENSIVE METABOLIC PANEL
ALT: 14 U/L (ref 0–44)
AST: 43 U/L — ABNORMAL HIGH (ref 15–41)
Albumin: 3.6 g/dL (ref 3.5–5.0)
Alkaline Phosphatase: 75 U/L (ref 38–126)
Anion gap: 11 (ref 5–15)
BUN: 22 mg/dL — ABNORMAL HIGH (ref 6–20)
CO2: 24 mmol/L (ref 22–32)
Calcium: 8.9 mg/dL (ref 8.9–10.3)
Chloride: 101 mmol/L (ref 98–111)
Creatinine, Ser: 1.08 mg/dL — ABNORMAL HIGH (ref 0.44–1.00)
GFR, Estimated: 60 mL/min — ABNORMAL LOW (ref >60)
Glucose, Bld: 107 mg/dL — ABNORMAL HIGH (ref 70–99)
Potassium: 4.9 mmol/L (ref 3.5–5.1)
Sodium: 136 mmol/L (ref 135–145)
Total Bilirubin: 1 mg/dL (ref 0.3–1.2)
Total Protein: 6.5 g/dL (ref 6.5–8.1)

## 2020-05-06 LAB — LIPASE, BLOOD: Lipase: 32 U/L (ref 11–51)

## 2020-05-06 MED ORDER — HYDROMORPHONE HCL 1 MG/ML IJ SOLN
1.0000 mg | Freq: Once | INTRAMUSCULAR | Status: AC
Start: 2020-05-06 — End: 2020-05-06
  Administered 2020-05-06: 1 mg via INTRAVENOUS
  Filled 2020-05-06: qty 1

## 2020-05-06 MED ORDER — MORPHINE SULFATE 30 MG PO TABS: 30.0000 mg | ORAL_TABLET | ORAL | 0 refills | Status: DC | PRN

## 2020-05-06 MED ORDER — MORPHINE SULFATE 15 MG PO TABS
15.0000 mg | ORAL_TABLET | Freq: Once | ORAL | Status: AC
  Administered 2020-05-06: 15 mg via ORAL
  Filled 2020-05-06: qty 1

## 2020-05-06 MED ORDER — HYDROMORPHONE HCL 1 MG/ML IJ SOLN
1.0000 mg | Freq: Once | INTRAMUSCULAR | Status: AC
  Administered 2020-05-06: 1 mg via INTRAVENOUS
  Filled 2020-05-06: qty 1

## 2020-05-06 MED ORDER — KETOROLAC TROMETHAMINE 15 MG/ML IJ SOLN
15.0000 mg | Freq: Once | INTRAMUSCULAR | Status: AC
  Administered 2020-05-06: 15 mg via INTRAVENOUS
  Filled 2020-05-06: qty 1

## 2020-05-06 MED ORDER — SODIUM CHLORIDE 0.9 % IV BOLUS
1000.0000 mL | Freq: Once | INTRAVENOUS | Status: AC
  Administered 2020-05-06: 1000 mL via INTRAVENOUS

## 2020-05-06 NOTE — ED Notes (Signed)
Triage not done due to reasons for visit - pt seen by provider here to see if we should proceed w/ triage. Pt here for LLQ pain w/ intractable vomiting q 15 min since 0115. Pt denies nausea. Pt & spouse had stomach virus before Easter.Patient is being discharged from the Urgent Care and sent to the Emergency Department via POV w/ s/o . Per Faustino Congress, NP, patient is in need of higher level of care due to LLQ pain, vomiting q 15 min & medical history . Patient is aware and verbalizes understanding of plan of care. There were no vitals filed for this visit.

## 2020-05-06 NOTE — ED Notes (Signed)
Pharmacy was called about Morphine tab that is needed for pt.

## 2020-05-06 NOTE — ED Notes (Signed)
Patient transported to CT for Renal Stone Study. 

## 2020-05-06 NOTE — Discharge Instructions (Addendum)
If you develop worsening, continued, or recurrent abdominal pain, uncontrolled vomiting, fever, chest or back pain, or any other new/concerning symptoms then return to the ER for evaluation.  

## 2020-05-06 NOTE — Discharge Instructions (Signed)
Go to the ER for further evaluation and treatment. 

## 2020-05-06 NOTE — ED Provider Notes (Signed)
[facility] DEPART[facility] Provider Note   CSN: [id number] Arrival date & time: 05/06/20  1019     History Chief Complaint  Patient presents with  . Abdominal Pain    Margaret Gray is a 60 y.o. female.  HPI 60 year old female presents with sudden onset left lower quadrant abdominal pain at about 12:30 AM.  Previous to this she was feeling fine.  She has been having numerous episodes of vomiting though she states she is not really nauseated.  The pain is severe sharp pain.  It does not radiate and there is no significant back pain.  No urinary symptoms, diarrhea or fever.  Has not taken anything for the pain.   Past Medical History:  Diagnosis Date  . Arthritis   . Gallstones   . GERD (gastroesophageal reflux disease)   . History of blood transfusion   . Hyperlipidemia   . Hypertension    "10years ago" as of 08/25/18  . UTI (urinary tract infection)     Patient Active Problem List   Diagnosis Date Noted  . Mixed hyperlipidemia 08/31/2018  . Essential hypertension 08/31/2018  . Primary osteoarthritis 08/31/2018  . Hypocalcemia 08/31/2018  . Hypoalbuminemia 08/31/2018  . B12 deficiency 08/31/2018  . Iron deficiency 08/31/2018  . Cardiomegaly 08/26/2018  . Lower extremity edema 08/26/2018  . Symptomatic anemia 08/25/2018    Past Surgical History:  Procedure Laterality Date  . APPENDECTOMY  1989  . CESAREAN SECTION    . CHOLECYSTECTOMY  1989  . HIP SURGERY    . TOTAL HIP ARTHROPLASTY Right 2009     OB History   No obstetric history on file.     Family History  Problem Relation Age of Onset  . Cancer Father        kidney  . Bladder Cancer Father        and mentioned it spreaded to bone as well  . Ulcerative colitis Mother   . Colon cancer Neg Hx   . Esophageal cancer Neg Hx     Social History   Tobacco Use  . Smoking status: Never Smoker  . Smokeless tobacco: Never Used  Vaping Use  . Vaping Use: Never used  Substance  Use Topics  . Alcohol use: Never  . Drug use: Never    Home Medications Prior to Admission medications   Medication Sig Start Date End Date Taking? Authorizing Provider  morphine (MSIR) 30 MG tablet Take 1 tablet (30 mg total) by mouth every 4 (four) hours as needed for severe pain. 05/06/20  Yes Sherwood Gambler, MD  Acetaminophen (TYLENOL PO) Take 1 tablet by mouth as needed. Patient not taking: Reported on 03/25/2020    [provider]  B-12, METHYLCOBALAMIN, SL Place 5,000 mcg under the tongue.    [provider]  celecoxib (CELEBREX) 200 MG capsule Take 1 capsule (200 mg total) by mouth daily. 03/26/20   Raylene Everts, MD  ferrous sulfate (FEOSOL) 325 (65 FE) MG tablet Take 1 tablet (325 mg total) by mouth 3 (three) times daily with meals. Patient not taking: Reported on 03/25/2020 08/27/18   Monica Becton, MD  ferrous sulfate 324 MG TBEC Take 324 mg by mouth.    [provider]  nystatin (MYCOSTATIN/NYSTOP) powder Apply topically 3 (three) times daily. Patient not taking: Reported on 03/25/2020 09/19/18   Emeterio Reeve, DO  omeprazole (PRILOSEC) 20 MG capsule Take 20 mg by mouth daily.    [provider]  pantoprazole (PROTONIX) 40  MG tablet TAKE 1 TABLET BY MOUTH TWICE A DAY Patient not taking: Reported on 03/25/2020 02/16/19   Cirigliano, Dominic Pea, DO  vitamin B-12 (CYANOCOBALAMIN) 1000 MCG tablet Take 1 tablet (1,000 mcg total) by mouth daily. Patient not taking: Reported on 03/25/2020 08/27/18   Monica Becton, MD  Vitamin D, Ergocalciferol, (DRISDOL) 1.25 MG (50000 UT) CAPS capsule TAKE 1 CAPSULE BY MOUTH EVERY 7 DAYS. TAKE X 12 TOTAL DOSES, THEN START 2000 UNITS DAILY Patient not taking: Reported on 03/25/2020 01/03/19   Emeterio Reeve, DO    Allergies    Penicillins, Sulfa antibiotics, and Tape  Review of Systems   Review of Systems  Constitutional: Negative for fever.  Gastrointestinal: Positive for abdominal pain and vomiting.  Negative for diarrhea.  Genitourinary: Negative for dysuria and hematuria.  All other systems reviewed and are negative.   Physical Exam Updated Vital Signs BP (!) 149/75   Pulse 84   Temp 98.5 F (36.9 C) (Oral)   Resp 19   SpO2 96%   Physical Exam Vitals and nursing note reviewed.  Constitutional:      Appearance: She is well-developed. She is obese.  HENT:     Head: Normocephalic and atraumatic.     Right Ear: External ear normal.     Left Ear: External ear normal.     Nose: Nose normal.  Eyes:     General:        Right eye: No discharge.        Left eye: No discharge.  Cardiovascular:     Rate and Rhythm: Normal rate and regular rhythm.     Heart sounds: Normal heart sounds.  Pulmonary:     Effort: Pulmonary effort is normal.     Breath sounds: Normal breath sounds.  Abdominal:     Palpations: Abdomen is soft.     Tenderness: There is no abdominal tenderness. There is left CVA tenderness (mild).  Skin:    General: Skin is warm and dry.  Neurological:     Mental Status: She is alert.  Psychiatric:        Mood and Affect: Mood is not anxious.     ED Results / Procedures / Treatments   Labs (all labs ordered are listed, but only abnormal results are displayed) Labs Reviewed  COMPREHENSIVE METABOLIC PANEL - Abnormal; Notable for the following components:      Result Value   Glucose, Bld 107 (*)    BUN 22 (*)    Creatinine, Ser 1.08 (*)    AST 43 (*)    GFR, Estimated 60 (*)    All other components within normal limits  URINALYSIS, ROUTINE W REFLEX MICROSCOPIC - Abnormal; Notable for the following components:   APPearance CLOUDY (*)    pH 9.0 (*)    All other components within normal limits  CBC WITH DIFFERENTIAL/PLATELET - Abnormal; Notable for the following components:   WBC 17.3 (*)    Hemoglobin 11.4 (*)    MCH 25.0 (*)    MCHC 29.2 (*)    RDW 15.6 (*)    Neutro Abs 15.0 (*)    Monocytes Absolute 1.4 (*)    Abs Immature Granulocytes 0.12 (*)     All other components within normal limits  LIPASE, BLOOD  CBC WITH DIFFERENTIAL/PLATELET    EKG None  Radiology CT Renal Stone Study  Result Date: 05/06/2020 CLINICAL DATA:  Flank pain.  Evaluate for kidney stone. EXAM: CT ABDOMEN AND PELVIS WITHOUT CONTRAST TECHNIQUE: Multidetector  CT imaging of the abdomen and pelvis was performed following the standard protocol without IV contrast. COMPARISON:  None FINDINGS: Lower chest: No acute abnormality. Hepatobiliary: No focal liver abnormality is seen. Status post cholecystectomy. No biliary dilatation. Pancreas: Unremarkable. No pancreatic ductal dilatation or surrounding inflammatory changes. Spleen: Normal in size without focal abnormality. Adrenals/Urinary Tract: Normal appearance of the adrenal glands. Right kidney is normal. No right renal calculi, hydronephrosis or mass. There are calcifications within the inferior pole collecting system of the left kidney which measure up to 10 mm. Left-sided hydronephrosis and perinephric fat stranding is identified. Stone at the left UPJ measures 1.6 x 0.7 cm, image 43/3. No focal bladder abnormality. Stomach/Bowel: Large hiatal hernia. Previous cholecystectomy. Distal colonic diverticulosis noted without signs of acute diverticulitis. Appendix appears normal. No evidence of bowel wall thickening, distention, or inflammatory changes. Vascular/Lymphatic: Aortic atherosclerosis. No enlarged abdominal or pelvic lymph nodes. Reproductive: Status post hysterectomy. No adnexal masses. Other: No free fluid or fluid collections. Large fat containing periumbilical hernia is identified measuring approximately 8.5 cm cranial caudal, image 77/7. Musculoskeletal: Chronic deformity of the left hip is identified. Previous right hip arthroplasty. Lumbar scoliosis and degenerative disc disease. IMPRESSION: 1. Left-sided hydronephrosis and perinephric fat stranding secondary to 1.6 x 0.7 cm left UPJ calculus. 2. Multiple left renal  calculi. 3. Large hiatal hernia. 4. Large fat containing periumbilical hernia. 5. Aortic atherosclerosis. Aortic Atherosclerosis (ICD10-I70.0). Electronically Signed   By: Kerby Moors M.D.   On: 05/06/2020 12:44    Procedures Procedures   Medications Ordered in ED Medications  sodium chloride 0.9 % bolus 1,000 mL (0 mLs Intravenous Stopped 05/06/20 1323)  HYDROmorphone (DILAUDID) injection 1 mg (1 mg Intravenous Given 05/06/20 1150)  morphine (MSIR) tablet 15 mg (15 mg Oral Given 05/06/20 1358)  HYDROmorphone (DILAUDID) injection 1 mg (1 mg Intravenous Given 05/06/20 1501)  ketorolac (TORADOL) 15 MG/ML injection 15 mg (15 mg Intravenous Given 05/06/20 1456)    ED Course  I have reviewed the triage vital signs and the nursing notes.  Pertinent labs & imaging results that were available during my care of the patient were reviewed by me and considered in my medical decision making (see chart for details).    MDM Rules/Calculators/A&P                          Patient's pain initially controlled with IV Dilaudid.  CT does confirm stone and shows a very large stone of 1.6 cm.  Pain started to come back so she was given repeat dose of Dilaudid and Toradol.  I discussed with Dr. Tresa Moore, who advised patient to be followed up closely as an outpatient.  At this point she does have a significant leukocytosis but this is probably reactive to some degree of vomiting.  No infectious symptoms.  Will discharge home with return precautions. Final Clinical Impression(s) / ED Diagnoses Final diagnoses:  Left ureteral stone    Rx / DC Orders ED Discharge Orders         Ordered    morphine (MSIR) 30 MG tablet  Every 4 hours PRN        05/06/20 1522           Sherwood Gambler, MD 05/06/20 1537

## 2020-05-06 NOTE — ED Triage Notes (Signed)
Pt coming from home. Complaint of sudden onset abdominal pain and emesis today 12 am.

## 2020-05-06 NOTE — ED Provider Notes (Signed)
[facility]    CSN: [id number] Arrival date & time: 05/06/20  0855      History   Chief Complaint Chief Complaint  Patient presents with  . Abdominal Pain    Left lower    HPI Margaret Gray is a 60 y.o. female.   Reports sudden onset severe abdominal pain at 1 AM this morning.  Reports that she is having vomiting when the pain comes, but without nausea.  Reports pain is mildly relieved after vomiting.  Has not had this pain before.  Has attempted to drink water but unable to keep it down.  Denies fever, diarrhea, urinary symptoms, rash, other symptoms  ROS per HPI  The history is provided by the patient.  Abdominal Pain   Past Medical History:  Diagnosis Date  . Arthritis   . Gallstones   . GERD (gastroesophageal reflux disease)   . History of blood transfusion   . Hyperlipidemia   . Hypertension    "10years ago" as of 08/25/18  . UTI (urinary tract infection)     Patient Active Problem List   Diagnosis Date Noted  . Mixed hyperlipidemia 08/31/2018  . Essential hypertension 08/31/2018  . Primary osteoarthritis 08/31/2018  . Hypocalcemia 08/31/2018  . Hypoalbuminemia 08/31/2018  . B12 deficiency 08/31/2018  . Iron deficiency 08/31/2018  . Cardiomegaly 08/26/2018  . Lower extremity edema 08/26/2018  . Symptomatic anemia 08/25/2018    Past Surgical History:  Procedure Laterality Date  . APPENDECTOMY  1989  . CESAREAN SECTION    . CHOLECYSTECTOMY  1989  . HIP SURGERY    . TOTAL HIP ARTHROPLASTY Right 2009    OB History   No obstetric history on file.      Home Medications    Prior to Admission medications   Medication Sig Start Date End Date Taking? Authorizing Provider  Acetaminophen (TYLENOL PO) Take 1 tablet by mouth as needed. Patient not taking: Reported on 03/25/2020    [provider]  B-12, METHYLCOBALAMIN, SL Place 5,000 mcg under the tongue.    [provider]  celecoxib (CELEBREX) 200 MG capsule Take 1  capsule (200 mg total) by mouth daily. 03/26/20   Raylene Everts, MD  ferrous sulfate (FEOSOL) 325 (65 FE) MG tablet Take 1 tablet (325 mg total) by mouth 3 (three) times daily with meals. Patient not taking: Reported on 03/25/2020 08/27/18   Monica Becton, MD  ferrous sulfate 324 MG TBEC Take 324 mg by mouth.    [provider]  nystatin (MYCOSTATIN/NYSTOP) powder Apply topically 3 (three) times daily. Patient not taking: Reported on 03/25/2020 09/19/18   Emeterio Reeve, DO  omeprazole (PRILOSEC) 20 MG capsule Take 20 mg by mouth daily.    [provider]  pantoprazole (PROTONIX) 40 MG tablet TAKE 1 TABLET BY MOUTH TWICE A DAY Patient not taking: Reported on 03/25/2020 02/16/19   Cirigliano, Dominic Pea, DO  vitamin B-12 (CYANOCOBALAMIN) 1000 MCG tablet Take 1 tablet (1,000 mcg total) by mouth daily. Patient not taking: Reported on 03/25/2020 08/27/18   Monica Becton, MD  Vitamin D, Ergocalciferol, (DRISDOL) 1.25 MG (50000 UT) CAPS capsule TAKE 1 CAPSULE BY MOUTH EVERY 7 DAYS. TAKE X 12 TOTAL DOSES, THEN START 2000 UNITS DAILY Patient not taking: Reported on 03/25/2020 01/03/19   Emeterio Reeve, DO    Family History Family History  Problem Relation Age of Onset  . Cancer Father        kidney  . Bladder Cancer Father  and mentioned it spreaded to bone as well  . Ulcerative colitis Mother   . Colon cancer Neg Hx   . Esophageal cancer Neg Hx     Social History Social History   Tobacco Use  . Smoking status: Never Smoker  . Smokeless tobacco: Never Used  Vaping Use  . Vaping Use: Never used  Substance Use Topics  . Alcohol use: Never  . Drug use: Never     Allergies   Penicillins, Sulfa antibiotics, and Tape   Review of Systems Review of Systems  Gastrointestinal: Positive for abdominal pain.     Physical Exam Triage Vital Signs ED Triage Vitals  Enc Vitals Group     BP      Pulse      Resp      Temp      Temp src      SpO2       Weight      Height      Head Circumference      Peak Flow      Pain Score      Pain Loc      Pain Edu?      Excl. in [place]?    No data found.  Updated Vital Signs There were no vitals taken for this visit.  Visual Acuity Right Eye Distance:   Left Eye Distance:   Bilateral Distance:    Right Eye Near:   Left Eye Near:    Bilateral Near:     Physical Exam Vitals and nursing note reviewed.  Constitutional:      General: She is not in acute distress.    Appearance: She is well-developed.  HENT:     Head: Normocephalic and atraumatic.     Mouth/Throat:     Mouth: Mucous membranes are moist.     Pharynx: Oropharynx is clear.  Eyes:     Extraocular Movements: Extraocular movements intact.     Conjunctiva/sclera: Conjunctivae normal.     Pupils: Pupils are equal, round, and reactive to light.  Cardiovascular:     Heart sounds: No murmur heard.   Pulmonary:     Effort: Pulmonary effort is normal. No respiratory distress.     Breath sounds: Normal breath sounds.  Abdominal:     Palpations: Abdomen is soft. There is no shifting dullness, fluid wave, hepatomegaly, splenomegaly, mass or pulsatile mass.     Tenderness: There is abdominal tenderness in the left lower quadrant. There is guarding. There is no right CVA tenderness, left CVA tenderness or rebound. Negative signs include Murphy's sign, Rovsing's sign, McBurney's sign, psoas sign and obturator sign.  Musculoskeletal:     Cervical back: Neck supple.  Skin:    General: Skin is warm and dry.     Capillary Refill: Capillary refill takes less than 2 seconds.  Neurological:     General: No focal deficit present.     Mental Status: She is alert and oriented to person, place, and time.  Psychiatric:        Mood and Affect: Mood normal.        Behavior: Behavior normal.      UC Treatments / Results  Labs (all labs ordered are listed, but only abnormal results are displayed) Labs Reviewed - No data to  display  EKG   Radiology No results found.  Procedures Procedures (including critical care time)  Medications Ordered in UC Medications - No data to display  Initial Impression / Assessment and Plan /  UC Course  I have reviewed the triage vital signs and the nursing notes.  Pertinent labs & imaging results that were available during my care of the patient were reviewed by me and considered in my medical decision making (see chart for details).    LLQ pain Vomiting  Discussed that given patient's symptoms and severity of pain, she would be best served in the ER Discussed that she likely needs stat labs and further imaging to evaluate where her pain is coming from [place] not to delay care by working her up here Verbalized understanding and is in agreement with treatment plan Declines EMS To ER via personal vehicle Stable at discharge Final Clinical Impressions(s) / UC Diagnoses   Final diagnoses:  Left lower quadrant abdominal pain  Vomiting without nausea, intractability of vomiting not specified, unspecified vomiting type     Discharge Instructions     Go to the ER for further evaluation and treatment    ED Prescriptions    None     PDMP not reviewed this encounter.   Faustino Congress, NP 05/06/20 [phone]

## 2020-05-07 ENCOUNTER — Other Ambulatory Visit (HOSPITAL_COMMUNITY): Payer: Medicare Other

## 2020-05-07 ENCOUNTER — Telehealth: Payer: Self-pay

## 2020-05-07 ENCOUNTER — Other Ambulatory Visit: Payer: Self-pay | Admitting: Urology

## 2020-05-07 DIAGNOSIS — N200 Calculus of kidney: Secondary | ICD-10-CM

## 2020-05-07 NOTE — Telephone Encounter (Signed)
Transition Care Management Unsuccessful Follow-up Telephone Call  Date of discharge and from where:  05/06/2020 from [facility]  Attempts:  1st Attempt  Reason for unsuccessful TCM follow-up call:  Left voice message

## 2020-05-08 NOTE — Telephone Encounter (Signed)
Transition Care Management Follow-up Telephone Call  Date of discharge and from where: 05/06/2020 from [facility]  How have you been since you were released from the hospital? Pt stated that she is feeling as good as can be expected. Pt has her lithotripsy for Monday morning.   Any questions or concerns? No  Items Reviewed:  Did the pt receive and understand the discharge instructions provided? Yes   Medications obtained and verified? Yes   Other? No   Any new allergies since your discharge? No   Dietary orders reviewed? n/a}  Do you have support at home? Yes   Functional Questionnaire: (I = Independent and D = Dependent) ADLs: I  Bathing/Dressing- I  Meal Prep- I  Eating- I  Maintaining continence- I  Transferring/Ambulation- I  Managing Meds- I  Follow up appointments reviewed:   PCP Hospital f/u appt confirmed? No   Specialist Hospital f/u appt confirmed? Yes  Scheduled to see Zacarias Pontes on 05/12/2020.   Are transportation arrangements needed? No   If their condition worsens, is the pt aware to call PCP or go to the Emergency Dept.? Yes  Was the patient provided with contact information for the PCP's office or ED? Yes  Was to pt encouraged to call back with questions or concerns? Yes

## 2020-05-09 ENCOUNTER — Other Ambulatory Visit (HOSPITAL_COMMUNITY)
Admission: RE | Admit: 2020-05-09 | Discharge: 2020-05-09 | Disposition: A | Discharge status: Home / Self Care | Payer: Medicare Other | Source: Ambulatory Visit | Attending: Urology | Admitting: Urology

## 2020-05-09 DIAGNOSIS — Z01812 Encounter for preprocedural laboratory examination: Secondary | ICD-10-CM | POA: Diagnosis present

## 2020-05-09 DIAGNOSIS — Z20822 Contact with and (suspected) exposure to covid-19: Secondary | ICD-10-CM | POA: Diagnosis not present

## 2020-05-09 NOTE — Progress Notes (Signed)
Patient to arrive at 0800 on 05/12/2020. History and medications reviewed. Pre-procedure instructions given. NPO after MN on Sunday except for clear liquids until 0600. Driver secured.

## 2020-05-09 NOTE — Progress Notes (Signed)
Left message to return call for ESWL instructions. 

## 2020-05-10 LAB — SARS CORONAVIRUS 2 (TAT 6-24 HRS): SARS Coronavirus 2: NEGATIVE

## 2020-05-12 ENCOUNTER — Encounter (HOSPITAL_BASED_OUTPATIENT_CLINIC_OR_DEPARTMENT_OTHER): Payer: Self-pay | Admitting: Urology

## 2020-05-12 ENCOUNTER — Encounter (HOSPITAL_BASED_OUTPATIENT_CLINIC_OR_DEPARTMENT_OTHER)
Admission: RE | Disposition: A | Discharge status: Home / Self Care | Payer: Self-pay | Source: Home / Self Care | Attending: Urology

## 2020-05-12 ENCOUNTER — Ambulatory Visit (HOSPITAL_COMMUNITY): Payer: Medicare Other

## 2020-05-12 ENCOUNTER — Ambulatory Visit (HOSPITAL_BASED_OUTPATIENT_CLINIC_OR_DEPARTMENT_OTHER)
Admission: RE | Admit: 2020-05-12 | Discharge: 2020-05-12 | Disposition: A | Discharge status: Home / Self Care | Payer: Medicare Other | Attending: Urology | Admitting: Urology

## 2020-05-12 DIAGNOSIS — Z96641 Presence of right artificial hip joint: Secondary | ICD-10-CM | POA: Diagnosis not present

## 2020-05-12 DIAGNOSIS — Z880 Allergy status to penicillin: Secondary | ICD-10-CM | POA: Diagnosis not present

## 2020-05-12 DIAGNOSIS — I10 Essential (primary) hypertension: Secondary | ICD-10-CM | POA: Insufficient documentation

## 2020-05-12 DIAGNOSIS — N200 Calculus of kidney: Secondary | ICD-10-CM | POA: Diagnosis present

## 2020-05-12 DIAGNOSIS — E669 Obesity, unspecified: Secondary | ICD-10-CM | POA: Insufficient documentation

## 2020-05-12 DIAGNOSIS — Z6841 Body Mass Index (BMI) 40.0 and over, adult: Secondary | ICD-10-CM | POA: Insufficient documentation

## 2020-05-12 DIAGNOSIS — Z888 Allergy status to other drugs, medicaments and biological substances status: Secondary | ICD-10-CM | POA: Diagnosis not present

## 2020-05-12 HISTORY — PX: EXTRACORPOREAL SHOCK WAVE LITHOTRIPSY: SHX1557

## 2020-05-12 SURGERY — LITHOTRIPSY, ESWL
Anesthesia: LOCAL | Laterality: Left

## 2020-05-12 MED ORDER — HYDROCODONE-ACETAMINOPHEN 5-325 MG PO TABS: 1.0000 | ORAL_TABLET | ORAL | 0 refills | Status: DC | PRN

## 2020-05-12 MED ORDER — DIAZEPAM 5 MG PO TABS
ORAL_TABLET | ORAL | Status: AC
  Filled 2020-05-12: qty 2

## 2020-05-12 MED ORDER — CIPROFLOXACIN HCL 500 MG PO TABS
500.0000 mg | ORAL_TABLET | ORAL | Status: AC
  Administered 2020-05-12: 500 mg via ORAL

## 2020-05-12 MED ORDER — TAMSULOSIN HCL 0.4 MG PO CAPS: 0.4000 mg | ORAL_CAPSULE | Freq: Every day | ORAL | 1 refills | Status: DC

## 2020-05-12 MED ORDER — DIAZEPAM 5 MG PO TABS
10.0000 mg | ORAL_TABLET | ORAL | Status: AC
  Administered 2020-05-12: 10 mg via ORAL

## 2020-05-12 MED ORDER — CIPROFLOXACIN HCL 500 MG PO TABS
ORAL_TABLET | ORAL | Status: AC
  Filled 2020-05-12: qty 1

## 2020-05-12 MED ORDER — DIPHENHYDRAMINE HCL 25 MG PO CAPS
ORAL_CAPSULE | ORAL | Status: AC
  Filled 2020-05-12: qty 1

## 2020-05-12 MED ORDER — DIPHENHYDRAMINE HCL 25 MG PO CAPS
25.0000 mg | ORAL_CAPSULE | ORAL | Status: AC
  Administered 2020-05-12: 25 mg via ORAL

## 2020-05-12 MED ORDER — SODIUM CHLORIDE 0.9 % IV SOLN: INTRAVENOUS | Status: DC

## 2020-05-12 NOTE — H&P (Signed)
See scanned H&P

## 2020-05-12 NOTE — Op Note (Signed)
See [facility] OP note scanned into chart. Also because of the size, density, location and other factors that cannot be anticipated I feel this will likely be a staged procedure. This fact supersedes any indication in the scanned [facility] operative note to the contrary.  

## 2020-05-12 NOTE — Discharge Instructions (Signed)
Laser Therapy for Kidney Stones, Care After This sheet gives you information about how to care for yourself after your procedure. Your health care provider may also give you more specific instructions. If you have problems or questions, contact your health care provider. What can I expect after the procedure? After the procedure, it is common to have:  Pain.  A burning sensation while urinating.  Small amounts of blood in your urine.  A need to urinate frequently.  Pieces of kidney stone in your urine.  Mild discomfort when urinating that may be felt in the back. You may experience this if you have a flexible tube (stent) in your ureter. Follow these instructions at home: Medicines  Take over-the-counter and prescription medicines only as told by your health care provider.  If you were prescribed an antibiotic medicine, take it as told by your health care provider. Do not stop taking the antibiotic even if you start to feel better.  Ask your health care provider if the medicine prescribed to you: ? Requires you to avoid driving or using heavy machinery. ? Can cause constipation. You may need to take actions to prevent or treat constipation, such as:  Take over-the-counter or prescription medicines.  Eat foods that are high in fiber, such as beans, whole grains, and fresh fruits and vegetables.  Limit foods that are high in fat and processed sugars, such as fried or sweet foods. Activity  Return to your normal activities as told by your health care provider. Ask your health care provider what activities are safe for you.  Do not drive for 24 hours if you were given a sedative during your procedure. General instructions  If your health care provider approves, you may take a warm bath to ease discomfort and burning.  Drink enough fluid to keep your urine pale yellow. Your health care provider may recommend drinking two 8 oz (237 mL) glasses of water per hour for a few hours  after your procedure.  You may be asked to strain your urine to collect any stone fragments that you pass. These fragments may be tested.  Keep all follow-up visits as told by your health care provider. This is important. If you have a stent, you will need to return to your health care provider to have the stent removed.   Contact a health care provider if you:  Have pain or a burning feeling that lasts more than 2 days.  Feel nauseous.  Vomit more and more often.  Have difficulty urinating.  Have pain that gets worse or does not get better with medicine. Get help right away if:  You are unable to urinate, even if your bladder feels full.  You have: ? Bright red blood or blood clots in your urine. ? More blood in your urine. ? Severe pain or discomfort. ? A fever or shaking chills. ? Abdominal pain. ? Difficulty breathing. ? Swelling in your legs. Summary  After the procedure, it is common to have a burning sensation while urinating and small amounts of blood in your urine.  Take over-the-counter and prescription medicines only as told by your health care provider.  Drink enough fluid to keep your urine pale yellow.  Keep all follow-up visits as told by your health care provider. This is important. This information is not intended to replace advice given to you by your health care provider. Make sure you discuss any questions you have with your health care provider. Document Revised: 09/08/2017 Document Reviewed: 09/08/2017  Elsevier Patient Education  2021 [employer].

## 2020-05-14 ENCOUNTER — Encounter (HOSPITAL_BASED_OUTPATIENT_CLINIC_OR_DEPARTMENT_OTHER): Payer: Self-pay | Admitting: Urology

## 2020-09-09 ENCOUNTER — Ambulatory Visit (INDEPENDENT_AMBULATORY_CARE_PROVIDER_SITE_OTHER): Payer: Medicare Other | Admitting: Osteopathic Medicine

## 2020-09-09 ENCOUNTER — Other Ambulatory Visit: Payer: Self-pay

## 2020-09-09 ENCOUNTER — Encounter: Payer: Self-pay | Admitting: Osteopathic Medicine

## 2020-09-09 VITALS — BP 158/76 | HR 89 | Ht 66.0 in | Wt 262.0 lb

## 2020-09-09 DIAGNOSIS — R5382 Chronic fatigue, unspecified: Secondary | ICD-10-CM | POA: Diagnosis not present

## 2020-09-09 DIAGNOSIS — R002 Palpitations: Secondary | ICD-10-CM

## 2020-09-09 DIAGNOSIS — M159 Polyosteoarthritis, unspecified: Secondary | ICD-10-CM | POA: Insufficient documentation

## 2020-09-09 DIAGNOSIS — R030 Elevated blood-pressure reading, without diagnosis of hypertension: Secondary | ICD-10-CM

## 2020-09-09 DIAGNOSIS — D62 Acute posthemorrhagic anemia: Secondary | ICD-10-CM | POA: Insufficient documentation

## 2020-09-09 DIAGNOSIS — I5032 Chronic diastolic (congestive) heart failure: Secondary | ICD-10-CM | POA: Insufficient documentation

## 2020-09-09 DIAGNOSIS — M150 Primary generalized (osteo)arthritis: Secondary | ICD-10-CM | POA: Insufficient documentation

## 2020-09-09 DIAGNOSIS — R0600 Dyspnea, unspecified: Secondary | ICD-10-CM | POA: Insufficient documentation

## 2020-09-09 DIAGNOSIS — E8809 Other disorders of plasma-protein metabolism, not elsewhere classified: Secondary | ICD-10-CM

## 2020-09-09 DIAGNOSIS — Z862 Personal history of diseases of the blood and blood-forming organs and certain disorders involving the immune mechanism: Secondary | ICD-10-CM | POA: Diagnosis not present

## 2020-09-09 DIAGNOSIS — K219 Gastro-esophageal reflux disease without esophagitis: Secondary | ICD-10-CM | POA: Insufficient documentation

## 2020-09-09 DIAGNOSIS — Z8711 Personal history of peptic ulcer disease: Secondary | ICD-10-CM | POA: Insufficient documentation

## 2020-09-09 DIAGNOSIS — E538 Deficiency of other specified B group vitamins: Secondary | ICD-10-CM

## 2020-09-09 NOTE — Progress Notes (Signed)
Margaret Gray is a 60 y.o. female who presents to  [place] at [facility]  today, 09/09/20, seeking care for the following:  Fatigue - feeling like she is lightheaded and tired, much the same as when she was significantly anemic few years ago. Reports feeling very low energy, no CP/SOB on exertion, no LOC/presyncope. Occaisonal feeling like heart is "squeezing" or beating too hard/fast. Pt reports normal home BP 120s/80s, hx white coat HTN      ASSESSMENT & PLAN with other pertinent findings:  The primary encounter diagnosis was Chronic fatigue. Diagnoses of History of anemia, White coat syndrome without diagnosis of hypertension, Hypoalbuminemia, B12 deficiency, and Palpitations were also pertinent to this visit.   EKG interpretation: Rate: 84 Rhythm: sinus Question premature SVT on rhythm strip  No ST/T changes concerning for acute ischemia/infarct  Previous EKG 08/2018 also ok   Reviewed Echo from 2020, no serious concerns   See pt instructions Low concern for cardiac/pulmonary cause of symptoms however low threshold for w/u cardiac in particular. Deconditioning likely a factor but pt is working on exercise habits. ER precautions reviewed. Ddx also includes but not limited to anemia (for which pt has strong history), thyroid d/o   Patient Instructions  Plan: Need some more information w/ labs EKG today looks ok - there are a few early beats (supraventricular complexes) but this is not a sing of danger, but it may be causing symptoms of palpitations / strong heart beat.  If all labs normal, will need to get you in w/ cardiology to discuss further testing. We can order a heart monitor but they may suggest other   Orders Placed This Encounter  Procedures   Urinalysis with Culture Reflex   CBC with Differential/Platelet   COMPLETE METABOLIC PANEL WITH GFR   Lipid panel   TSH   VITAMIN D 25 Hydroxy (Vit-D Deficiency, Fractures)    Vitamin B12   Fe+TIBC+Fer   EKG 12-Lead    No orders of the defined types were placed in this encounter.    See below for relevant physical exam findings  See below for recent lab and imaging results reviewed  Medications, allergies, PMH, PSH, SocH, FamH reviewed below    Follow-up instructions: Return for RECHECK PENDING RESULTS / IF WORSE OR CHANGE.                                        Exam:  BP (!) 158/76   Pulse 89   Ht '5\' 6"'$  (1.676 m)   Wt 262 lb (118.8 kg)   BMI 42.29 kg/m  Constitutional: VS see above. General Appearance: alert, well-developed, well-nourished, NAD Neck: No masses, trachea midline.  Respiratory: Normal respiratory effort. no wheeze, no rhonchi, no rales Cardiovascular: S1/S2 normal, (A999333 systolic murmur, no rub/gallop auscultated. RRR.  Musculoskeletal: Symmetric and independent movement of all extremities Neurological: Normal balance/coordination. No tremor. Skin: warm, dry, intact.  Psychiatric: Normal judgment/insight. Normal mood and affect. Oriented x3.   No outpatient medications have been marked as taking for the 09/09/20 encounter (Office Visit) with Emeterio Reeve, DO.    Allergies  Allergen Reactions   Penicillins Rash    Rash on legs Did it involve swelling of the face/tongue/throat, SOB, or low BP? No Did it involve sudden or severe rash/hives, skin peeling, or any reaction on the inside of your mouth or nose? Yes  Did you need to seek medical attention at a hospital or doctor's office? No When did it last happen?    60 yrs old   If all above answers are "NO", may proceed with cephalosporin use.   Sulfa Antibiotics Rash   Tape Itching and Rash    Reaction to surgical tape    Patient Active Problem List   Diagnosis Date Noted   Mixed hyperlipidemia 08/31/2018   Essential hypertension 08/31/2018   Primary osteoarthritis 08/31/2018   Hypocalcemia 08/31/2018   Hypoalbuminemia 08/31/2018    B12 deficiency 08/31/2018   Iron deficiency 08/31/2018   Cardiomegaly 08/26/2018   Lower extremity edema 08/26/2018   Symptomatic anemia 08/25/2018    Family History  Problem Relation Age of Onset   Cancer Father        kidney   Bladder Cancer Father        and mentioned it spreaded to bone as well   Ulcerative colitis Mother    Colon cancer Neg Hx    Esophageal cancer Neg Hx     Social History   Tobacco Use  Smoking Status Never  Smokeless Tobacco Never    Past Surgical History:  Procedure Laterality Date   APPENDECTOMY  1989   CESAREAN SECTION     CHOLECYSTECTOMY  1989   EXTRACORPOREAL SHOCK WAVE LITHOTRIPSY Left 05/12/2020   Procedure: LEFT EXTRACORPOREAL SHOCK WAVE LITHOTRIPSY (ESWL);  Surgeon: Lucas Mallow, MD;  Location: [facility];  Service: Urology;  Laterality: Left;   HIP SURGERY     TOTAL HIP ARTHROPLASTY Right 2009     There is no immunization history on file for this patient.  No results found for this or any previous visit (from the past 2160 hour(s)).  No results found.     All questions at time of visit were answered - patient instructed to contact office with any additional concerns or updates. ER/RTC precautions were reviewed with the patient as applicable.   Please note: manual typing as well as voice recognition software may have been used to produce this document - typos may escape review. Please contact Dr. Sheppard Coil for any needed clarifications.

## 2020-09-09 NOTE — Patient Instructions (Addendum)
Plan: Need some more information w/ labs EKG today looks ok - there are a few early beats (supraventricular complexes) but this is not a sing of danger, but it may be causing symptoms of palpitations / strong heart beat.  If all labs normal, will need to get you in w/ cardiology to discuss further testing. We can order a heart monitor but they may suggest other

## 2020-09-10 LAB — CBC WITH DIFFERENTIAL/PLATELET
Absolute Monocytes: 614 cells/uL (ref 200–950)
Basophils Absolute: 118 cells/uL (ref 0–200)
Basophils Relative: 2 %
Eosinophils Absolute: 230 cells/uL (ref 15–500)
Eosinophils Relative: 3.9 %
HCT: 21.7 % — ABNORMAL LOW (ref 35.0–45.0)
Hemoglobin: 5.7 g/dL — CL (ref 11.7–15.5)
Lymphs Abs: 1174 cells/uL (ref 850–3900)
MCH: 18.2 pg — ABNORMAL LOW (ref 27.0–33.0)
MCHC: 26.3 g/dL — ABNORMAL LOW (ref 32.0–36.0)
MCV: 69.1 fL — ABNORMAL LOW (ref 80.0–100.0)
MPV: 11.6 fL (ref 7.5–12.5)
Monocytes Relative: 10.4 %
Neutro Abs: 3764 cells/uL (ref 1500–7800)
Neutrophils Relative %: 63.8 %
Platelets: 358 10*3/uL (ref 140–400)
RBC: 3.14 10*6/uL — ABNORMAL LOW (ref 3.80–5.10)
RDW: 19.2 % — ABNORMAL HIGH (ref 11.0–15.0)
Total Lymphocyte: 19.9 %
WBC: 5.9 10*3/uL (ref 3.8–10.8)

## 2020-09-10 LAB — COMPLETE METABOLIC PANEL WITH GFR
AG Ratio: 1.6 (calc) (ref 1.0–2.5)
ALT: 6 U/L (ref 6–29)
AST: 9 U/L — ABNORMAL LOW (ref 10–35)
Albumin: 4 g/dL (ref 3.6–5.1)
Alkaline phosphatase (APISO): 66 U/L (ref 37–153)
BUN: 19 mg/dL (ref 7–25)
CO2: 26 mmol/L (ref 20–32)
Calcium: 9.2 mg/dL (ref 8.6–10.4)
Chloride: 103 mmol/L (ref 98–110)
Creat: 0.66 mg/dL (ref 0.50–1.03)
Globulin: 2.5 g/dL (calc) (ref 1.9–3.7)
Glucose, Bld: 113 mg/dL — ABNORMAL HIGH (ref 65–99)
Potassium: 4.7 mmol/L (ref 3.5–5.3)
Sodium: 137 mmol/L (ref 135–146)
Total Bilirubin: 0.4 mg/dL (ref 0.2–1.2)
Total Protein: 6.5 g/dL (ref 6.1–8.1)
eGFR: 102 mL/min/{1.73_m2} (ref >=60)

## 2020-09-10 LAB — LIPID PANEL
Cholesterol: 188 mg/dL (ref <200)
HDL: 49 mg/dL — ABNORMAL LOW (ref >=50)
LDL Cholesterol (Calc): 112 mg/dL (calc) — ABNORMAL HIGH
Non-HDL Cholesterol (Calc): 139 mg/dL (calc) — ABNORMAL HIGH (ref <130)
Total CHOL/HDL Ratio: 3.8 (calc) (ref <5.0)
Triglycerides: 156 mg/dL — ABNORMAL HIGH (ref <150)

## 2020-09-10 LAB — TSH: TSH: 2.98 mIU/L (ref 0.40–4.50)

## 2020-09-10 LAB — IRON,TIBC AND FERRITIN PANEL
%SAT: 1 % (calc) — ABNORMAL LOW (ref 16–45)
Ferritin: 1 ng/mL — ABNORMAL LOW (ref 16–232)
Iron: <10 ug/dL — ABNORMAL LOW (ref 45–160)
TIBC: 508 mcg/dL (calc) — ABNORMAL HIGH (ref 250–450)

## 2020-09-10 LAB — VITAMIN D 25 HYDROXY (VIT D DEFICIENCY, FRACTURES): Vit D, 25-Hydroxy: 19 ng/mL — ABNORMAL LOW (ref 30–100)

## 2020-09-10 LAB — VITAMIN B12: Vitamin B-12: 606 pg/mL (ref 200–1100)

## 2020-09-10 LAB — CBC MORPHOLOGY

## 2020-09-11 LAB — URINALYSIS W MICROSCOPIC + REFLEX CULTURE
Bacteria, UA: NONE SEEN /HPF
Bilirubin Urine: NEGATIVE
Glucose, UA: NEGATIVE
Hgb urine dipstick: NEGATIVE
Hyaline Cast: NONE SEEN /LPF
Ketones, ur: NEGATIVE
Nitrites, Initial: NEGATIVE
Protein, ur: NEGATIVE
RBC / HPF: NONE SEEN /HPF (ref 0–2)
Specific Gravity, Urine: 1.009 (ref 1.001–1.035)
pH: 6 (ref 5.0–8.0)

## 2020-09-11 LAB — URINE CULTURE
MICRO NUMBER:: 12313759
SPECIMEN QUALITY:: ADEQUATE

## 2020-09-11 LAB — CULTURE INDICATED

## 2020-09-12 ENCOUNTER — Telehealth: Payer: Medicare Other | Admitting: Family Medicine

## 2020-09-12 ENCOUNTER — Telehealth: Payer: Self-pay | Admitting: General Practice

## 2020-09-12 DIAGNOSIS — R300 Dysuria: Secondary | ICD-10-CM

## 2020-09-12 LAB — HM COLONOSCOPY

## 2020-09-12 MED ORDER — NITROFURANTOIN MONOHYD MACRO 100 MG PO CAPS: 100.0000 mg | ORAL_CAPSULE | Freq: Two times a day (BID) | ORAL | 0 refills | Status: AC

## 2020-09-12 NOTE — Progress Notes (Signed)

## 2020-09-12 NOTE — Telephone Encounter (Signed)
Transition Care Management Unsuccessful Follow-up Telephone Call  Date of discharge and from where:  09/11/20 from [facility]  Attempts:  1st Attempt  Reason for unsuccessful TCM follow-up call:  Left voice message

## 2020-09-16 NOTE — Telephone Encounter (Signed)
Transition Care Management Unsuccessful Follow-up Telephone Call  Date of discharge and from where:  09/11/2020 from [facility].   Attempts:  1st Attempt  Reason for unsuccessful TCM follow-up call:  Left voice message

## 2020-09-19 NOTE — Telephone Encounter (Signed)
Transition Care Management Unsuccessful Follow-up Telephone Call  Date of discharge and from where:  09/11/20 from [facility]  Attempts:  3rd Attempt  Reason for unsuccessful TCM follow-up call:  Left voice message

## 2020-09-23 ENCOUNTER — Other Ambulatory Visit: Payer: Self-pay

## 2020-09-23 ENCOUNTER — Encounter: Payer: Self-pay | Admitting: Osteopathic Medicine

## 2020-09-23 ENCOUNTER — Ambulatory Visit (INDEPENDENT_AMBULATORY_CARE_PROVIDER_SITE_OTHER): Payer: Medicare Other | Admitting: Osteopathic Medicine

## 2020-09-23 VITALS — BP 151/93 | HR 102 | Temp 98.5°F | Wt 262.0 lb

## 2020-09-23 DIAGNOSIS — D649 Anemia, unspecified: Secondary | ICD-10-CM

## 2020-09-23 LAB — CBC
HCT: 30.9 % — ABNORMAL LOW (ref 35.0–45.0)
Hemoglobin: 8.8 g/dL — ABNORMAL LOW (ref 11.7–15.5)
MCH: 22.1 pg — ABNORMAL LOW (ref 27.0–33.0)
MCHC: 28.5 g/dL — ABNORMAL LOW (ref 32.0–36.0)
MCV: 77.6 fL — ABNORMAL LOW (ref 80.0–100.0)
MPV: 11.1 fL (ref 7.5–12.5)
Platelets: 518 10*3/uL — ABNORMAL HIGH (ref 140–400)
RBC: 3.98 10*6/uL (ref 3.80–5.10)
RDW: 23.8 % — ABNORMAL HIGH (ref 11.0–15.0)
WBC: 6.9 10*3/uL (ref 3.8–10.8)

## 2020-09-23 MED ORDER — PANTOPRAZOLE SODIUM 40 MG PO TBEC: 40.0000 mg | DELAYED_RELEASE_TABLET | Freq: Every day | ORAL | 3 refills | Status: AC

## 2020-09-23 NOTE — Progress Notes (Signed)
Margaret Gray is a 60 y.o. female who presents to  [place] at [facility]  today, 09/23/20, seeking care for the following:  HOSPITAL FOLLOW-UP: ANEMIA D/C Summary reviewed: seen in office here for fatigue, sent to ED after lab results showed Hgb 5.7. Admitted 09/09/20-09/11/20 LOS 3 days, acute blood loss anemia, w/ iron deficiency not taking supplements, B12 deficiency (normal MMA) not taking supplements. Hematology and GI consulted. S/p PRBC, Hgb on d/c home 7.9.  Of note, cardiomegaly on CXR, Echo performed, EF WNL, normal diastolic fxn GI - EGD and colonoscopy (+)hiatal hernia, diverticulosis, hemorrhoids --> consider capsule endoscopy as outpatient, f/u pathology from EGD. Has appt w/ GI 09/24/20  Hematology - IV iron, increased folic acid. Plan for outpatient f/u w/ Dr. Conley Simmonds but patient states that nothing was given to her in terms of a follow-up plan with hematology, despite her asking her nurse prior to discharge.  L arm concern patient worried about infection from IV.  She said she had an 18-gauge in both of her AC, on the left there was some swelling and redness which has since resolved.   ASSESSMENT & PLAN with other pertinent findings:  The encounter diagnosis was Anemia, multifactorial (GI bleed, Iron deficiency, B12 deficiency).   Follow-up on CBC today Referral placed to hematology, discussed with patient that they may want to continue IV iron infusions, depending on CBC/other factors Refilled patient's pantoprazole, she states she definitely notices that heartburn/acid reflux is a lot worse on omeprazole and is well controlled by pantoprazole.  We discussed the potential issues with long-term use PPI but in her case I think the benefits outweigh the risks  There are no Patient Instructions on file for this visit.  Orders Placed This Encounter  Procedures   CBC   Ambulatory referral to Hematology / Oncology    Meds  ordered this encounter  Medications   pantoprazole (PROTONIX) 40 MG tablet    Sig: Take 1 tablet (40 mg total) by mouth daily.    Dispense:  90 tablet    Refill:  3     See below for relevant physical exam findings  See below for recent lab and imaging results reviewed  Medications, allergies, PMH, PSH, SocH, FamH reviewed below    Follow-up instructions: Return for RECHECK PENDING RESULTS. ROUTINE CHECK-UP / ANNUAL IN 6 MOS OR SO .                                        Exam:  BP (!) 151/93 (BP Location: Right Arm, Patient Position: Sitting, Cuff Size: Large)   Pulse (!) 102   Temp 98.5 F (36.9 C) (Oral)   Wt 262 lb (118.8 kg)   BMI 42.29 kg/m  Constitutional: VS see above. General Appearance: alert, well-developed, well-nourished, NAD Neck: No masses, trachea midline.  Respiratory: Normal respiratory effort. no wheeze, no rhonchi, no rales Cardiovascular: S1/S2 normal, 2/6 systolic murmur, no rub/gallop auscultated. RRR.  Neurological: Normal balance/coordination. No tremor. Skin: warm, dry, intact.  Psychiatric: Normal judgment/insight. Normal mood and affect. Oriented x3.   Current Meds  Medication Sig   ferrous sulfate 324 MG TBEC Take 324 mg by mouth.   vitamin B-12 (CYANOCOBALAMIN) 1000 MCG tablet Take 1 tablet (1,000 mcg total) by mouth daily.   [DISCONTINUED] pantoprazole (PROTONIX) 40 MG tablet Take by mouth.    Allergies  Allergen  Reactions   Penicillins Rash    Rash on legs Did it involve swelling of the face/tongue/throat, SOB, or low BP? No Did it involve sudden or severe rash/hives, skin peeling, or any reaction on the inside of your mouth or nose? Yes Did you need to seek medical attention at a hospital or doctor's office? No When did it last happen?    60 yrs old   If all above answers are "NO", may proceed with cephalosporin use.   Sulfa Antibiotics Rash   Tape Itching and Rash    Reaction to surgical tape     Patient Active Problem List   Diagnosis Date Noted   Mixed hyperlipidemia 08/31/2018   Essential hypertension 08/31/2018   Primary osteoarthritis 08/31/2018   Hypocalcemia 08/31/2018   Hypoalbuminemia 08/31/2018   B12 deficiency 08/31/2018   Iron deficiency 08/31/2018   Cardiomegaly 08/26/2018   Lower extremity edema 08/26/2018   Symptomatic anemia 08/25/2018    Family History  Problem Relation Age of Onset   Cancer Father        kidney   Bladder Cancer Father        and mentioned it spreaded to bone as well   Ulcerative colitis Mother    Colon cancer Neg Hx    Esophageal cancer Neg Hx     Social History   Tobacco Use  Smoking Status Never  Smokeless Tobacco Never    Past Surgical History:  Procedure Laterality Date   APPENDECTOMY  1989   CESAREAN SECTION     CHOLECYSTECTOMY  1989   EXTRACORPOREAL SHOCK WAVE LITHOTRIPSY Left 05/12/2020   Procedure: LEFT EXTRACORPOREAL SHOCK WAVE LITHOTRIPSY (ESWL);  Surgeon: Lucas Mallow, MD;  Location: [facility];  Service: Urology;  Laterality: Left;   HIP SURGERY     TOTAL HIP ARTHROPLASTY Right 2009     There is no immunization history on file for this patient.  Recent Results (from the past 2160 hour(s))  Urinalysis with Culture Reflex     Status: Abnormal   Collection Time: 09/09/20 12:00 AM   Specimen: Urine  Result Value Ref Range   Color, Urine YELLOW YELLOW   APPearance CLEAR CLEAR   Specific Gravity, Urine 1.009 1.001 - 1.035   pH 6.0 5.0 - 8.0   Glucose, UA NEGATIVE NEGATIVE   Bilirubin Urine NEGATIVE NEGATIVE   Ketones, ur NEGATIVE NEGATIVE   Hgb urine dipstick NEGATIVE NEGATIVE   Protein, ur NEGATIVE NEGATIVE   Nitrites, Initial NEGATIVE NEGATIVE   Leukocyte Esterase 1+ (A) NEGATIVE   WBC, UA 6-10 (A) 0 - 5 /HPF   RBC / HPF NONE SEEN 0 - 2 /HPF   Squamous Epithelial / LPF 6-10 (A) < OR = 5 /HPF   Bacteria, UA NONE SEEN NONE SEEN /HPF   Hyaline Cast NONE SEEN NONE SEEN /LPF    Note      Comment: This urine was analyzed for the presence of WBC,  RBC, bacteria, casts, and other formed elements.  Only those elements seen were reported. . .   CBC with Differential/Platelet     Status: Abnormal   Collection Time: 09/09/20 12:00 AM  Result Value Ref Range   WBC 5.9 3.8 - 10.8 Thousand/uL   RBC 3.14 (L) 3.80 - 5.10 Million/uL   Hemoglobin 5.7 (LL) 11.7 - 15.5 g/dL    Comment: Verified by repeat analysis. Marland Kitchen    HCT 21.7 (L) 35.0 - 45.0 %   MCV 69.1 (L) 80.0 - 100.0  fL   MCH 18.2 (L) 27.0 - 33.0 pg   MCHC 26.3 (L) 32.0 - 36.0 g/dL   RDW 19.2 (H) 11.0 - 15.0 %   Platelets 358 140 - 400 Thousand/uL   MPV 11.6 7.5 - 12.5 fL   Neutro Abs 3,764 1,500 - 7,800 cells/uL   Lymphs Abs 1,174 850 - 3,900 cells/uL   Absolute Monocytes 614 200 - 950 cells/uL   Eosinophils Absolute 230 15 - 500 cells/uL   Basophils Absolute 118 0 - 200 cells/uL   Neutrophils Relative % 63.8 %   Total Lymphocyte 19.9 %   Monocytes Relative 10.4 %   Eosinophils Relative 3.9 %   Basophils Relative 2.0 %  COMPLETE METABOLIC PANEL WITH GFR     Status: Abnormal   Collection Time: 09/09/20 12:00 AM  Result Value Ref Range   Glucose, Bld 113 (H) 65 - 99 mg/dL    Comment: .            Fasting reference interval . For someone without known diabetes, a glucose value between 100 and 125 mg/dL is consistent with prediabetes and should be confirmed with a follow-up test. .    BUN 19 7 - 25 mg/dL   Creat 0.66 0.50 - 1.03 mg/dL   eGFR 102 > OR = 60 mL/min/1.78m    Comment: The eGFR is based on the CKD-EPI 2021 equation. To calculate  the new eGFR from a previous Creatinine or Cystatin C result, go to https://www.kidney.org/professionals/ kdoqi/gfr%5Fcalculator    BUN/Creatinine Ratio NOT APPLICABLE 6 - 22 (calc)   Sodium 137 135 - 146 mmol/L   Potassium 4.7 3.5 - 5.3 mmol/L   Chloride 103 98 - 110 mmol/L   CO2 26 20 - 32 mmol/L   Calcium 9.2 8.6 - 10.4 mg/dL   Total Protein 6.5 6.1 -  8.1 g/dL   Albumin 4.0 3.6 - 5.1 g/dL   Globulin 2.5 1.9 - 3.7 g/dL (calc)   AG Ratio 1.6 1.0 - 2.5 (calc)   Total Bilirubin 0.4 0.2 - 1.2 mg/dL   Alkaline phosphatase (APISO) 66 37 - 153 U/L   AST 9 (L) 10 - 35 U/L   ALT 6 6 - 29 U/L  Lipid panel     Status: Abnormal   Collection Time: 09/09/20 12:00 AM  Result Value Ref Range   Cholesterol 188 <200 mg/dL   HDL 49 (L) > OR = 50 mg/dL   Triglycerides 156 (H) <150 mg/dL   LDL Cholesterol (Calc) 112 (H) mg/dL (calc)    Comment: Reference range: <100 . Desirable range <100 mg/dL for primary prevention;   <70 mg/dL for patients with CHD or diabetic patients  with > or = 2 CHD risk factors. .Marland KitchenLDL-C is now calculated using the Martin-Hopkins  calculation, which is a validated novel method providing  better accuracy than the Friedewald equation in the  estimation of LDL-C.  MCresenciano Genreet al. JAnnamaria Helling 20131;438(88: [id number]  (http://education.[facility].com/faq/FAQ164)    Total CHOL/HDL Ratio 3.8 <5.0 (calc)   Non-HDL Cholesterol (Calc) 139 (H) <130 mg/dL (calc)    Comment: For patients with diabetes plus 1 major ASCVD risk  factor, treating to a non-HDL-C goal of <100 mg/dL  (LDL-C of <70 mg/dL) is considered a therapeutic  option.   TSH     Status: None   Collection Time: 09/09/20 12:00 AM  Result Value Ref Range   TSH 2.98 0.40 - 4.50 mIU/L  VITAMIN D 25 Hydroxy (Vit-D Deficiency, Fractures)  Status: Abnormal   Collection Time: 09/09/20 12:00 AM  Result Value Ref Range   Vit D, 25-Hydroxy 19 (L) 30 - 100 ng/mL    Comment: Vitamin D Status         25-OH Vitamin D: . Deficiency:                    <20 ng/mL Insufficiency:             20 - 29 ng/mL Optimal:                 > or = 30 ng/mL . For 25-OH Vitamin D testing on patients on  D2-supplementation and patients for whom quantitation  of D2 and D3 fractions is required, the QuestAssureD(TM) 25-OH VIT D, (D2,D3), LC/MS/MS is recommended: order  code [phone]  (patients >48yr). See Note 1 . Note 1 . For additional information, please refer to  http://education.[facility].com/faq/FAQ199  (This link is being provided for informational/ educational purposes only.)   Vitamin B12     Status: None   Collection Time: 09/09/20 12:00 AM  Result Value Ref Range   Vitamin B-12 606 200 - 1,100 pg/mL  Fe+TIBC+Fer     Status: Abnormal   Collection Time: 09/09/20 12:00 AM  Result Value Ref Range   Iron <10 (L) 45 - 160 mcg/dL    Comment: Verified by repeat analysis. .Marland Kitchen   TIBC 508 (H) 250 - 450 mcg/dL (calc)   %SAT 1 (L) 16 - 45 % (calc)   Ferritin 1 (L) 16 - 232 ng/mL  CBC MORPHOLOGY     Status: None   Collection Time: 09/09/20 12:00 AM  Result Value Ref Range   CBC MORPHOLOGY  NORMAL    Comment: Tear-drop cells 2 + Elliptocytes 2 + Target cells 1 + Anisocytosis 1 + Microcytosis 1 + Poikilocytosis 1 + Hypochromasia 2 +   Urine Culture     Status: Abnormal   Collection Time: 09/09/20 12:00 AM  Result Value Ref Range   MICRO NUMBER: [id number]   SPECIMEN QUALITY: Adequate    Sample Source URINE    STATUS: FINAL    ISOLATE 1: Streptococcus agalactiae (A)     Comment: 10,000-49,000 CFU/mL of Group B Streptococcus isolated Beta-hemolytic streptococci are predictably susceptible to Penicillin and other beta-lactams. Susceptibility testing not routinely performed. Please contact the laboratory within 3 days if  susceptibility testing is desired. Erythromycin and clindamycin are not recommended for treatment of urinary tract infections, but clindamycin may be useful for treatment of rectovaginal colonization or infection.   REFLEXIVE URINE CULTURE     Status: None   Collection Time: 09/09/20 12:00 AM  Result Value Ref Range   REFLEXIVE URINE CULTURE      Comment: CULTURE INDICATED - RESULTS TO FOLLOW    No results found.     All questions at time of visit were answered - patient instructed to contact office with any additional  concerns or updates. ER/RTC precautions were reviewed with the patient as applicable.   Please note: manual typing as well as voice recognition software may have been used to produce this document - typos may escape review. Please contact Dr. ASheppard Coilfor any needed clarifications.   Total encounter time on date of service, 09/23/20, was 30 minutes spent addressing problems/issues as noted above in [place] including time spent in discussion with patient regarding the HPI, ROS, confirming history, reviewing Assessment & Plan, as well as time spent on coordination  of care, record review.

## 2020-11-10 ENCOUNTER — Ambulatory Visit: Payer: Medicare Other | Admitting: Family Medicine

## 2020-11-10 ENCOUNTER — Other Ambulatory Visit: Payer: Self-pay

## 2020-11-10 ENCOUNTER — Ambulatory Visit (INDEPENDENT_AMBULATORY_CARE_PROVIDER_SITE_OTHER): Payer: Medicare Other | Admitting: Family Medicine

## 2020-11-10 ENCOUNTER — Encounter: Payer: Self-pay | Admitting: Family Medicine

## 2020-11-10 VITALS — BP 128/84 | HR 102 | Temp 98.3°F | Wt 273.0 lb

## 2020-11-10 DIAGNOSIS — M1991 Primary osteoarthritis, unspecified site: Secondary | ICD-10-CM

## 2020-11-10 DIAGNOSIS — D649 Anemia, unspecified: Secondary | ICD-10-CM | POA: Diagnosis not present

## 2020-11-10 NOTE — Progress Notes (Signed)
Acute Office Visit  Subjective:    Patient ID: Margaret Gray, female    DOB: 1960/12/04, 60 y.o.   MRN: [id number]  Chief Complaint  Patient presents with   [place]    HPI Patient is in today for jury duty letter.   Patient reports she was summoned for jury duty in [place] on December 08, 2020.  She states that she would not be able to make this work because she is primarily in a wheelchair and requires assistance with mobility, her husband is not able to take time off work.  Also reports she has poor stamina and would not be able to remain in a court room all day -states he needs to be able to reposition, stretch, stay well-hydrated, frequent bathroom breaks.    Juror #12, 12/08/20, [facility]  She denies any acute concerns today.   Past Medical History:  Diagnosis Date   Arthritis    Gallstones    GERD (gastroesophageal reflux disease)    History of blood transfusion    Hyperlipidemia    Hypertension    "10years ago" as of 08/25/18   UTI (urinary tract infection)     Past Surgical History:  Procedure Laterality Date   [place] LITHOTRIPSY Left 05/12/2020   Procedure: LEFT EXTRACORPOREAL SHOCK WAVE LITHOTRIPSY (ESWL);  Surgeon: Lucas Mallow, MD;  Location: [facility];  Service: Urology;  Laterality: Left;   HIP SURGERY     TOTAL HIP ARTHROPLASTY Right 2009    Family History  Problem Relation Age of Onset   Cancer Father        kidney   Bladder Cancer Father        and mentioned it spreaded to bone as well   Ulcerative colitis Mother    Colon cancer Neg Hx    Esophageal cancer Neg Hx     Social History   Socioeconomic History   Marital status: Married    Spouse name: Not on file   Number of children: 1   Years of education: Not on file   Highest education level: Not on file  Occupational History   Not on file  Tobacco Use    Smoking status: Never   Smokeless tobacco: Never  Vaping Use   Vaping Use: Never used  Substance and Sexual Activity   Alcohol use: Never   Drug use: Never   Sexual activity: Yes    Partners: Male    Birth control/protection: None  Other Topics Concern   Not on file  Social History Narrative   Not on file   Social Determinants of Health   Financial Resource Strain: Not on file  Food Insecurity: Not on file  Transportation Needs: Not on file  Physical Activity: Not on file  Stress: Not on file  Social Connections: Not on file  Intimate Partner Violence: Not on file    Outpatient Medications Prior to Visit  Medication Sig Dispense Refill   ferrous sulfate 324 MG TBEC Take 324 mg by mouth.     pantoprazole (PROTONIX) 40 MG tablet Take 1 tablet (40 mg total) by mouth daily. 90 tablet 3   vitamin B-12 (CYANOCOBALAMIN) 1000 MCG tablet Take 1 tablet (1,000 mcg total) by mouth daily. 30 tablet 1   No facility-administered medications prior to visit.    Allergies  Allergen Reactions   Other  Itching and Rash    Reaction to surgical tape   Penicillins Rash and Hives    Rash on legs Did it involve swelling of the face/tongue/throat, SOB, or low BP? No Did it involve sudden or severe rash/hives, skin peeling, or any reaction on the inside of your mouth or nose? Yes Did you need to seek medical attention at a hospital or doctor's office? No When did it last happen?    60 yrs old   If all above answers are "NO", may proceed with cephalosporin use.   Sulfa Antibiotics Rash   Tape Itching and Rash    Reaction to surgical tape    Review of Systems All review of systems negative except what is listed in the HPI     Objective:    Physical Exam Vitals reviewed.  Constitutional:      Appearance: Normal appearance.  HENT:     Head: Normocephalic and atraumatic.  Cardiovascular:     Rate and Rhythm: Normal rate and regular rhythm.     Heart sounds: Murmur heard.  Pulmonary:      Effort: Pulmonary effort is normal.     Breath sounds: Normal breath sounds.  Skin:    General: Skin is warm and dry.  Neurological:     Mental Status: She is alert and oriented to person, place, and time.  Psychiatric:        Mood and Affect: Mood normal.        Behavior: Behavior normal.        Thought Content: Thought content normal.        Judgment: Judgment normal.      BP 128/84 (BP Location: Left Arm, Patient Position: Sitting, Cuff Size: Large)   Pulse (!) 102   Temp 98.3 F (36.8 C) (Oral)   Wt 273 lb 0.6 oz (123.9 kg)   BMI 44.07 kg/m  Wt Readings from Last 3 Encounters:  11/10/20 273 lb 0.6 oz (123.9 kg)  09/23/20 262 lb (118.8 kg)  09/09/20 262 lb (118.8 kg)    Health Maintenance Due  Topic Date Due   Hepatitis C Screening  Never done   PAP SMEAR-Modifier  Never done    There are no preventive care reminders to display for this patient.   Lab Results  Component Value Date   TSH 2.98 09/09/2020   Lab Results  Component Value Date   WBC 6.9 09/23/2020   HGB 8.8 (L) 09/23/2020   HCT 30.9 (L) 09/23/2020   MCV 77.6 (L) 09/23/2020   PLT 518 (H) 09/23/2020   Lab Results  Component Value Date   NA 137 09/09/2020   K 4.7 09/09/2020   CO2 26 09/09/2020   GLUCOSE 113 (H) 09/09/2020   BUN 19 09/09/2020   CREATININE 0.66 09/09/2020   BILITOT 0.4 09/09/2020   ALKPHOS 75 05/06/2020   AST 9 (L) 09/09/2020   ALT 6 09/09/2020   PROT 6.5 09/09/2020   ALBUMIN 3.6 05/06/2020   CALCIUM 9.2 09/09/2020   ANIONGAP 11 05/06/2020   EGFR 102 09/09/2020   Lab Results  Component Value Date   CHOL 188 09/09/2020   Lab Results  Component Value Date   HDL 49 (L) 09/09/2020   Lab Results  Component Value Date   LDLCALC 112 (H) 09/09/2020   Lab Results  Component Value Date   TRIG 156 (H) 09/09/2020   Lab Results  Component Value Date   CHOLHDL 3.8 09/09/2020   No results found for: HGBA1C  Assessment & Plan:  1. Primary osteoarthritis,  unspecified site 2. Symptomatic anemia No acute concerns today. She has been following with hem/onc for blood transfusions. Poor stamina, needing wheelchair primarily as well as additional assistance for mobility, stretching, etc. Will write letter for jury duty exemption. Patient aware of signs/symptoms requiring further/urgent evaluation.  Follow-up as previously scheduled.   Purcell Nails Olevia Bowens, DNP, FNP-C

## 2021-03-23 ENCOUNTER — Ambulatory Visit: Payer: Medicare Other | Admitting: Medical-Surgical

## 2021-10-22 ENCOUNTER — Other Ambulatory Visit: Payer: Self-pay | Admitting: Osteopathic Medicine

## 2022-12-27 IMAGING — DX DG ABDOMEN 1V
2 series · 2 of 2 positions shown · non-contrast
Comparison: CT abdomen pelvis dated 05/06/2020.

CLINICAL DATA: 60-year-old female with left-sided kidney stone.

EXAM:
ABDOMEN - 1 VIEW

[abdomen kub (1 of 2)]
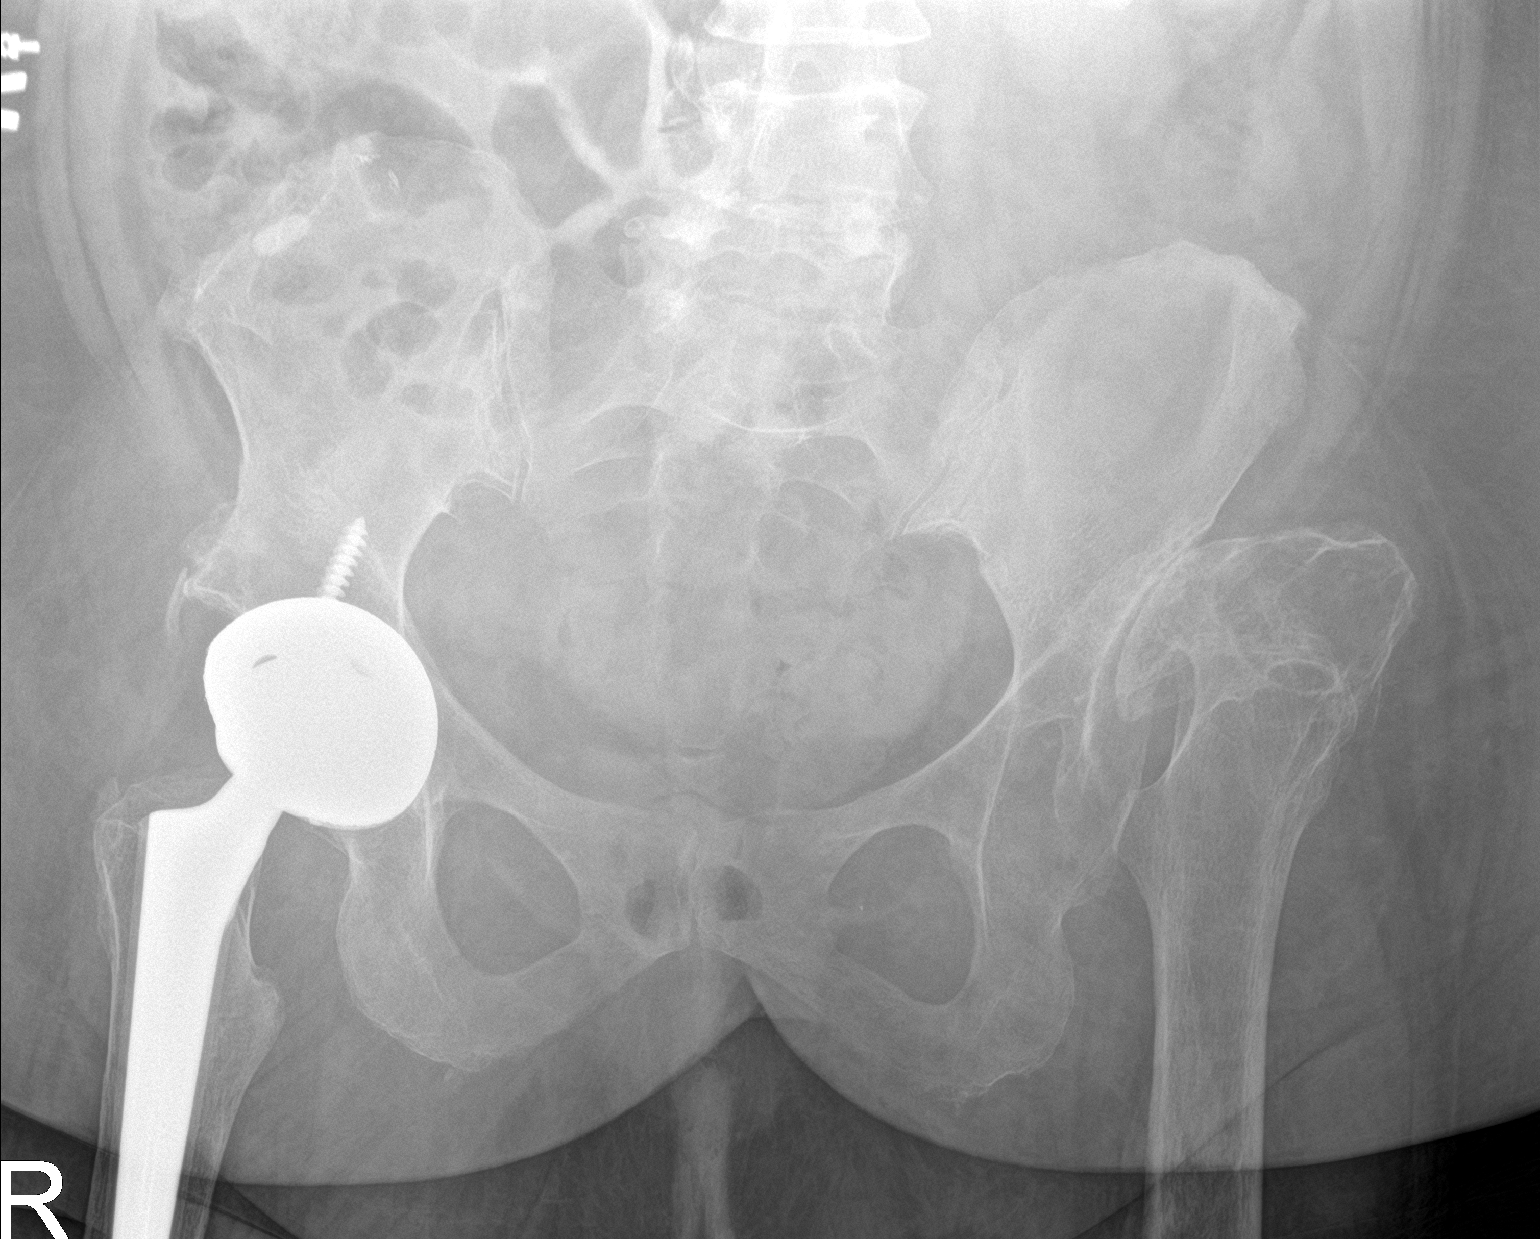

[abdomen kub (2 of 2)]
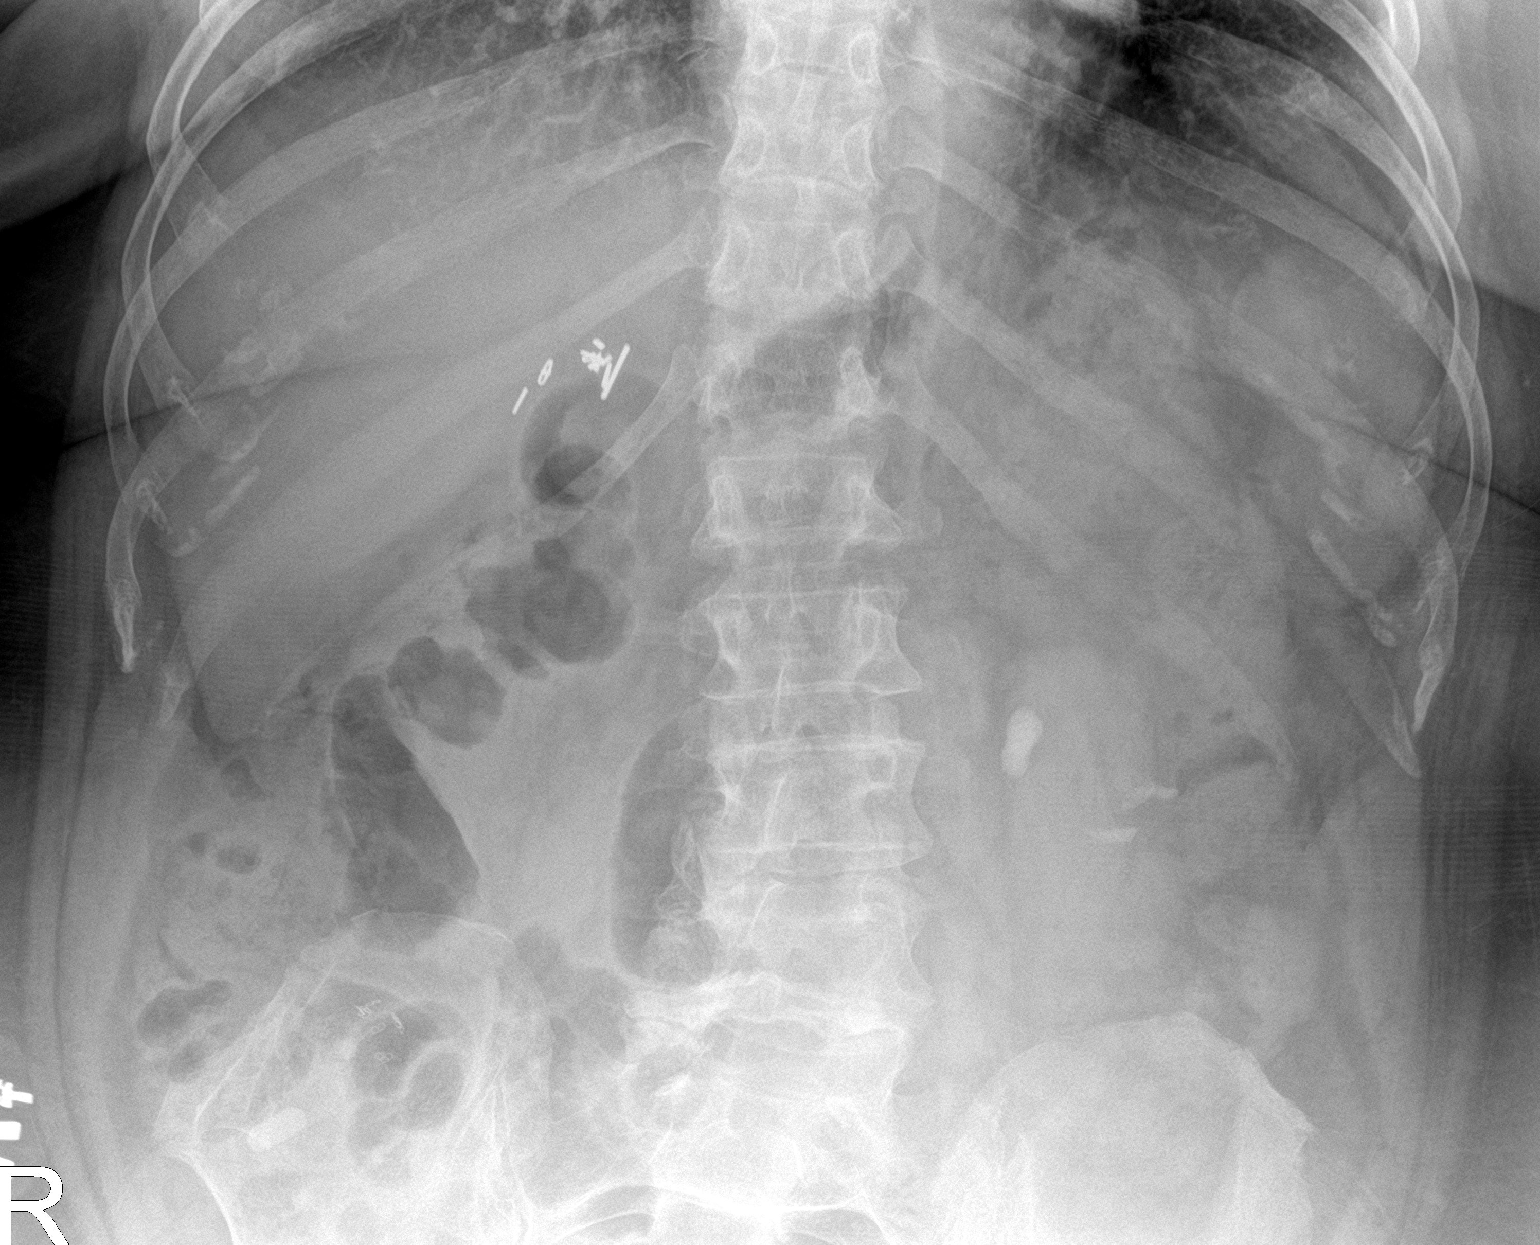

[2 of 2 positions shown; findings below may reference images not displayed]

FINDINGS: Several left renal calculi with the largest measuring approximately
18 mm in similar position as prior CT. No radiopaque calculus noted
over the right renal silhouette or along the course of the ureters
or urinary bladder. No free air. No bowel dilatation or evidence of
obstruction. Right upper quadrant cholecystectomy clips. There is a
total right hip arthroplasty. Chronic deformity of the left hip with
subluxation. There is osteopenia with degenerative changes of the
spine. No acute osseous pathology.
IMPRESSION: Left renal calculi as seen on the prior CT.
